# Patient Record
Sex: Male | Born: 1985 | Race: Black or African American | Hispanic: No
Health system: Midwestern US, Community
[De-identification: ages and names within clinical notes are randomized; demographics above are authoritative.]

## PROBLEM LIST (undated history)

## (undated) DIAGNOSIS — L309 Dermatitis, unspecified: Secondary | ICD-10-CM

## (undated) DIAGNOSIS — J45909 Unspecified asthma, uncomplicated: Secondary | ICD-10-CM

## (undated) HISTORY — PX: NASAL SEPTUM SURGERY: SHX37

---

## 2015-07-09 ENCOUNTER — Emergency Department: Admit: 2015-07-10 | Payer: Self-pay

## 2015-07-09 DIAGNOSIS — J45901 Unspecified asthma with (acute) exacerbation: Secondary | ICD-10-CM

## 2015-07-09 NOTE — ED Notes (Signed)
I have reviewed discharge instructions with the patient.  The patient verbalized understanding.

## 2015-07-09 NOTE — ED Provider Notes (Signed)
HPI Comments: Patient presents with recurrent and respiratory distress yesterday had increasing difficulty breathing this evening he has a history of asthma but denies any history of intubation or recent hospitalizations. He denies fever, chills, or cough.     Patient is a 29 y.o. male presenting with shortness of breath. The history is provided by the patient.   Shortness of Breath   This is a new problem. The problem occurs intermittently.The current episode started 12 to 24 hours ago. The problem has been gradually worsening. Associated symptoms include wheezing. Pertinent negatives include no fever, no headaches, no coryza, no rhinorrhea, no sore throat, no swollen glands, no ear pain, no neck pain, no cough, no sputum production, no hemoptysis, no PND, no orthopnea, no chest pain, no syncope, no vomiting, no abdominal pain, no rash, no leg pain, no leg swelling and no claudication. Precipitated by: pt continues to smoke. He has tried nothing for the symptoms. The treatment provided no relief. He has had prior hospitalizations. He has had prior ED visits. He has had no prior ICU admissions. Associated medical issues include asthma.        Past Medical History:   Diagnosis Date   ??? Asthma    ??? Psychiatric disorder        History reviewed. No pertinent past surgical history.      History reviewed. No pertinent family history.    Social History     Social History   ??? Marital status: UNKNOWN     Spouse name: N/A   ??? Number of children: N/A   ??? Years of education: N/A     Occupational History   ??? Not on file.     Social History Main Topics   ??? Smoking status: Current Every Day Smoker   ??? Smokeless tobacco: Not on file   ??? Alcohol use Yes   ??? Drug use: Not on file   ??? Sexual activity: Not on file     Other Topics Concern   ??? Not on file     Social History Narrative   ??? No narrative on file         ALLERGIES: Haldol [haloperidol lactate]    Review of Systems   Constitutional: Negative for fever.    HENT: Negative for ear pain, rhinorrhea and sore throat.    Respiratory: Positive for shortness of breath and wheezing. Negative for cough, hemoptysis and sputum production.    Cardiovascular: Negative for chest pain, orthopnea, claudication, leg swelling, syncope and PND.   Gastrointestinal: Negative for abdominal pain and vomiting.   Musculoskeletal: Negative for neck pain.   Skin: Negative for rash.   Neurological: Negative for headaches.   All other systems reviewed and are negative.      Vitals:    07/09/15 2221 07/09/15 2223 07/09/15 2225   BP:  131/61    Pulse: 84 84    Resp: (!) 52     Temp: 98.1 ??F (36.7 ??C)     SpO2:   98%   Weight:   97.5 kg (215 lb)   Height:    (1.676 m)            Physical Exam   Constitutional: He is oriented to person, place, and time. He appears well-developed and well-nourished. No distress.   HENT:   Head: Normocephalic and atraumatic.   Eyes: Conjunctivae and EOM are normal. Pupils are equal, round, and reactive to light.   Neck: Normal range of motion. Neck supple.  Cardiovascular: Normal rate, regular rhythm and normal heart sounds.    Pulmonary/Chest: He is in respiratory distress. He has wheezes.   Abdominal: Soft. Bowel sounds are normal.   Musculoskeletal: Normal range of motion.   Neurological: He is alert and oriented to person, place, and time.   Skin: Skin is warm and dry.   Psychiatric: He has a normal mood and affect. His behavior is normal.   Nursing note and vitals reviewed.       MDM  Number of Diagnoses or Management Options  Diagnosis management comments: Asthma exacerbation       Amount and/or Complexity of Data Reviewed  Clinical lab tests: ordered and reviewed  Tests in the radiology section of CPT??: ordered and reviewed  Review and summarize past medical records: yes  Independent visualization of images, tracings, or specimens: yes    Risk of Complications, Morbidity, and/or Mortality  Presenting problems: high  Diagnostic procedures: high   Management options: moderate    Patient Progress  Patient progress: stable    ED Course       Procedures

## 2015-07-09 NOTE — ED Triage Notes (Signed)
Pt. Presents to er from the lobby hyperverntilating, states he smokes and has asthma. Refusing i.v at first. Not wanting to sit in the bed

## 2015-07-10 ENCOUNTER — Inpatient Hospital Stay
Admit: 2015-07-10 | Discharge: 2015-07-10 | Disposition: A | Discharge status: Home / Self Care | Payer: Self-pay | Attending: Emergency Medicine

## 2015-07-10 LAB — METABOLIC PANEL, BASIC
Anion gap: 8 mmol/L (ref 7–16)
BUN: 8 MG/DL (ref 6–23)
CO2: 27 mmol/L (ref 21–32)
Calcium: 9.1 MG/DL (ref 8.3–10.4)
Chloride: 104 mmol/L (ref 98–107)
Creatinine: 1.1 MG/DL (ref 0.8–1.5)
GFR est AA: >60 mL/min/{1.73_m2} (ref >60)
GFR est non-AA: >60 mL/min/{1.73_m2} (ref >60)
Glucose: 87 mg/dL (ref 65–100)
Potassium: 3.8 mmol/L (ref 3.5–5.1)
Sodium: 139 mmol/L (ref 136–145)

## 2015-07-10 LAB — BLOOD GAS, ARTERIAL
ALLENS TEST: POSITIVE
BASE EXCESS: 1.7 mmol/L (ref 0–3)
BICARBONATE: 26 mmol/L (ref 22–26)
FIO2: 40 %
PCO2: 39 mmHg (ref 35–45)
PO2: 147 mmHg — ABNORMAL HIGH (ref 75.0–100.0)
pH: 7.44 (ref 7.35–7.45)

## 2015-07-10 LAB — MAGNESIUM: Magnesium: 1.8 mg/dL (ref 1.8–2.4)

## 2015-07-10 LAB — CBC WITH AUTOMATED DIFF
ABS. BASOPHILS: 0 10*3/uL (ref 0.0–0.2)
ABS. EOSINOPHILS: 3.7 10*3/uL — ABNORMAL HIGH (ref 0.0–0.8)
ABS. IMM. GRANS.: 0 10*3/uL (ref 0.0–0.5)
ABS. LYMPHOCYTES: 2 10*3/uL (ref 0.5–4.6)
ABS. MONOCYTES: 0.4 10*3/uL (ref 0.1–1.3)
ABS. NEUTROPHILS: 3.5 10*3/uL (ref 1.7–8.2)
BASOPHILS: 0 % (ref 0.0–2.0)
EOSINOPHILS: 38 % — ABNORMAL HIGH (ref 0.5–7.8)
HCT: 41.6 % (ref 41.1–50.3)
HGB: 13.8 g/dL (ref 13.6–17.2)
IMMATURE GRANULOCYTES: 0.2 % (ref 0.0–5.0)
LYMPHOCYTES: 21 % (ref 13–44)
MCH: 29.4 PG (ref 26.1–32.9)
MCHC: 33.2 g/dL (ref 31.4–35.0)
MCV: 88.5 FL (ref 79.6–97.8)
MONOCYTES: 4 % (ref 4.0–12.0)
MPV: 10.5 FL — ABNORMAL LOW (ref 10.8–14.1)
NEUTROPHILS: 37 % — ABNORMAL LOW (ref 43–78)
PLATELET: 368 10*3/uL (ref 150–450)
RBC: 4.7 M/uL (ref 4.23–5.67)
RDW: 13.5 % (ref 11.9–14.6)
WBC: 9.7 10*3/uL (ref 4.3–11.1)

## 2015-07-10 LAB — BNP: BNP: 4 pg/mL

## 2015-07-10 MED ORDER — ALBUTEROL SULFATE 2.5 MG/0.5 ML NEB SOLUTION
2.5 mg/0.5 mL | RESPIRATORY_TRACT | Status: AC
Start: 2015-07-10 — End: 2015-07-09
  Administered 2015-07-10: 03:00:00 via RESPIRATORY_TRACT

## 2015-07-10 MED ORDER — METHYLPREDNISOLONE (PF) 125 MG/2 ML IJ SOLR
125 mg/2 mL | INTRAMUSCULAR | Status: AC
Start: 2015-07-10 — End: 2015-07-09
  Administered 2015-07-10: 04:00:00 via INTRAVENOUS

## 2015-07-10 MED ORDER — PREDNISONE 20 MG TAB
20 mg | ORAL_TABLET | Freq: Every day | ORAL | 0 refills | Status: AC
Start: 2015-07-10 — End: 2015-07-16

## 2015-07-10 MED ORDER — ALBUTEROL SULFATE 90 MCG/ACTUATION BREATH ACTIVATED POWDER INHALER
90 mcg/actuation | RESPIRATORY_TRACT | 0 refills | Status: AC | PRN
Start: 2015-07-10 — End: ?

## 2015-07-10 MED ORDER — ALBUTEROL SULFATE 0.083 % (0.83 MG/ML) SOLN FOR INHALATION
2.5 mg /3 mL (0.083 %) | RESPIRATORY_TRACT | Status: DC
Start: 2015-07-10 — End: 2015-07-09

## 2015-07-10 MED ORDER — ALBUTEROL SULFATE HFA 90 MCG/ACTUATION AEROSOL INHALER
90 mcg/actuation | RESPIRATORY_TRACT | 2 refills | Status: AC | PRN
Start: 2015-07-10 — End: ?

## 2015-07-10 MED FILL — SOLU-MEDROL (PF) 125 MG/2 ML SOLUTION FOR INJECTION: 125 mg/2 mL | INTRAMUSCULAR | Qty: 2

## 2015-07-10 MED FILL — ALBUTEROL SULFATE 0.083 % (0.83 MG/ML) SOLN FOR INHALATION: 2.5 mg /3 mL (0.083 %) | RESPIRATORY_TRACT | Qty: 4

## 2018-04-27 ENCOUNTER — Encounter (HOSPITAL_COMMUNITY): Payer: Self-pay | Admitting: Nurse Practitioner

## 2018-04-27 ENCOUNTER — Emergency Department (HOSPITAL_COMMUNITY): Payer: Self-pay

## 2018-04-27 ENCOUNTER — Emergency Department (HOSPITAL_COMMUNITY)
Admission: EM | Admit: 2018-04-27 | Discharge: 2018-04-27 | Disposition: A | Discharge status: Home / Self Care | Payer: Self-pay | Attending: Emergency Medicine | Admitting: Emergency Medicine

## 2018-04-27 DIAGNOSIS — J45901 Unspecified asthma with (acute) exacerbation: Secondary | ICD-10-CM | POA: Insufficient documentation

## 2018-04-27 HISTORY — DX: Dermatitis, unspecified: L30.9

## 2018-04-27 HISTORY — DX: Unspecified asthma, uncomplicated: J45.909

## 2018-04-27 LAB — CBC WITH DIFFERENTIAL/PLATELET
BASOS ABS: 0 10*3/uL (ref 0.0–0.1)
BASOS PCT: 0 %
EOS ABS: 2.2 10*3/uL — AB (ref 0.0–0.7)
EOS PCT: 24 %
HEMATOCRIT: 45.3 % (ref 39.0–52.0)
Hemoglobin: 15 g/dL (ref 13.0–17.0)
Lymphocytes Relative: 19 %
Lymphs Abs: 1.7 10*3/uL (ref 0.7–4.0)
MCH: 30.4 pg (ref 26.0–34.0)
MCHC: 33.1 g/dL (ref 30.0–36.0)
MCV: 91.7 fL (ref 78.0–100.0)
MONO ABS: 0.4 10*3/uL (ref 0.1–1.0)
Monocytes Relative: 4 %
Neutro Abs: 5 10*3/uL (ref 1.7–7.7)
Neutrophils Relative %: 53 %
PLATELETS: 266 10*3/uL (ref 150–400)
RBC: 4.94 MIL/uL (ref 4.22–5.81)
RDW: 13.1 % (ref 11.5–15.5)
WBC: 9.3 10*3/uL (ref 4.0–10.5)

## 2018-04-27 LAB — BASIC METABOLIC PANEL
Anion gap: 6 (ref 5–15)
BUN: 11 mg/dL (ref 6–20)
CO2: 25 mmol/L (ref 22–32)
CREATININE: 0.86 mg/dL (ref 0.61–1.24)
Calcium: 9.1 mg/dL (ref 8.9–10.3)
Chloride: 104 mmol/L (ref 98–111)
GFR calc Af Amer: >60 mL/min (ref >60)
GLUCOSE: 118 mg/dL — AB (ref 70–99)
Potassium: 3.7 mmol/L (ref 3.5–5.1)
Sodium: 135 mmol/L (ref 135–145)

## 2018-04-27 MED ORDER — ALBUTEROL (5 MG/ML) CONTINUOUS INHALATION SOLN
10.0000 mg/h | INHALATION_SOLUTION | Freq: Once | RESPIRATORY_TRACT | Status: AC
  Administered 2018-04-27: 10 mg/h via RESPIRATORY_TRACT
  Filled 2018-04-27: qty 20

## 2018-04-27 MED ORDER — ALBUTEROL SULFATE HFA 108 (90 BASE) MCG/ACT IN AERS: 1.0000 | INHALATION_SPRAY | Freq: Four times a day (QID) | RESPIRATORY_TRACT | 3 refills | Status: DC | PRN

## 2018-04-27 MED ORDER — SODIUM CHLORIDE 0.9 % IV BOLUS: 1000.0000 mL | Freq: Once | INTRAVENOUS | Status: DC

## 2018-04-27 MED ORDER — PREDNISONE 20 MG PO TABS: 40.0000 mg | ORAL_TABLET | Freq: Every day | ORAL | 0 refills | Status: AC

## 2018-04-27 MED ORDER — ALBUTEROL SULFATE HFA 108 (90 BASE) MCG/ACT IN AERS
1.0000 | INHALATION_SPRAY | Freq: Once | RESPIRATORY_TRACT | Status: AC
  Administered 2018-04-27: 1 via RESPIRATORY_TRACT
  Filled 2018-04-27: qty 6.7

## 2018-04-27 NOTE — ED Triage Notes (Signed)
Pt is presented from home with c/o an asthma attack, pt states he is new in the area and excess physical activity may have triggered this attack although he has been struggling with a cough/cold since Wednesday. EMS administered 5mg  Albuterol, 1 mg of Atrovent and 125 mg Solumedrol. NO marked response to all this on arrival.

## 2018-04-27 NOTE — Discharge Instructions (Signed)
Take prednisone as directed.   Use albuterol inhaler as directed.   Follow up with [facility] to establish primary care.   Return to the Emergency Department for any worsening difficulty breathing, fever, chest pain or any other worsening or concerning symptoms.

## 2018-04-27 NOTE — ED Provider Notes (Signed)
[facility]-EMERGENCY DEPT Provider Note   CSN: [id number] Arrival date & time: 04/27/18  1821     History   Chief Complaint Chief Complaint  Patient presents with  . Asthma  . Shortness of Breath    HPI Micheal Kirk is a 32 y.o. male brought in by EMS for evaluation of shortness of breath consistent with asthma exacerbation that is been ongoing since 10 AM this morning.  He states he has had some intermittent difficulty breathing, wheezing over the last 3 days but worsened approximately 10 AM.  Patient reports that he recently moved to the area and had been moving boxes and doing a lot of physical activity over the last 24 hours.  He states that dust can be a trigger of his asthma.  He states that he just moved to the area and he does not have any of his inhalers or medication.  He called EMS today because he felt like he was having difficulty breathing and wheezing.  On EMS arrival, patient was wheezing throughout all lung fields.  He received albuterol neb, DuoNeb x2, Solu-Medrol and route with some improvement but still with significant wheezing.  He states that he has had a cough for last few days.  He reports cough is productive of phlegm.  He recalls report some chest tightness which he states is consistent with his asthma exacerbations.  Chest tightness has improved since being in the ED.  Patient denies any fevers, abdominal pain, nausea/vomiting.  He reports he has never had been intubated for his asthma but does report he has had to have admissions.  His last admission was approximately 2 months ago.  The history is provided by the patient.    Past Medical History:  Diagnosis Date  . Asthma   . Eczema     There are no active problems to display for this patient.   The histories are not reviewed yet. Please review them in the "History" navigator section and refresh this SmartLink.      Home Medications    Prior to Admission medications     Medication Sig Start Date End Date Taking? Authorizing Provider  triamcinolone cream (KENALOG) 0.1 % Apply 1 application topically daily.   Yes [provider]  albuterol (PROVENTIL HFA;VENTOLIN HFA) 108 (90 Base) MCG/ACT inhaler Inhale 1-2 puffs into the lungs every 6 (six) hours as needed for wheezing or shortness of breath. 04/27/18   Maxwell Caul, PA-C  predniSONE (DELTASONE) 20 MG tablet Take 2 tablets (40 mg total) by mouth daily for 4 days. 04/27/18 05/01/18  Maxwell Caul, PA-C    Family History No family history on file.  Social History Social History   Tobacco Use  . Smoking status: Not on file  Substance Use Topics  . Alcohol use: Not on file  . Drug use: Not on file     Allergies   Tylenol [acetaminophen]   Review of Systems Review of Systems  Constitutional: Negative for fever.  HENT: Negative for congestion.   Respiratory: Positive for cough, chest tightness, shortness of breath and wheezing.   Cardiovascular: Negative for chest pain.  Gastrointestinal: Negative for abdominal pain, diarrhea, nausea and vomiting.  Musculoskeletal: Negative for back pain and neck pain.  Skin: Negative for rash.  Neurological: Negative for dizziness, weakness, numbness and headaches.  All other systems reviewed and are negative.    Physical Exam Updated Vital Signs BP (!) 153/80 (BP Location: Right Arm)   Pulse Marland Kitchen)  103   Temp 97.9 F (36.6 C) (Oral)   Resp 14   SpO2 100%   Physical Exam  Constitutional: He is oriented to person, place, and time. He appears well-developed and well-nourished.  HENT:  Head: Normocephalic and atraumatic.  Mouth/Throat: Oropharynx is clear and moist and mucous membranes are normal.  Eyes: Pupils are equal, round, and reactive to light. Conjunctivae, EOM and lids are normal.  Neck: Full passive range of motion without pain.  Cardiovascular: Normal rate, regular rhythm, normal heart sounds and normal pulses. Exam reveals no  gallop and no friction rub.  No murmur heard. Pulmonary/Chest: He has wheezes.  Increased work of breathing noted.  Diffuse wheezing noted throughout all lung fields.  Speaking in short sentences.  Abdominal: Soft. Normal appearance. There is no tenderness. There is no rigidity and no guarding.  Musculoskeletal: Normal range of motion.  Neurological: He is alert and oriented to person, place, and time.  Skin: Skin is warm and dry. Capillary refill takes less than 2 seconds.  Psychiatric: He has a normal mood and affect. His speech is normal.  Nursing note and vitals reviewed.    ED Treatments / Results  Labs (all labs ordered are listed, but only abnormal results are displayed) Labs Reviewed  BASIC METABOLIC PANEL - Abnormal; Notable for the following components:      Result Value   Glucose, Bld 118 (*)    All other components within normal limits  CBC WITH DIFFERENTIAL/PLATELET - Abnormal; Notable for the following components:   Eosinophils Absolute 2.2 (*)    All other components within normal limits    EKG EKG Interpretation  Date/Time:  Sunday April 27 2018 19:07:32 EDT Ventricular Rate:  84 PR Interval:    QRS Duration: 93 QT Interval:  364 QTC Calculation: 431 R Axis:   32 Text Interpretation:  Sinus rhythm Baseline wander in lead(s) V6 No old tracing to compare Confirmed by Wentz, Elliott ([id number]) on 04/27/2018 8:20:49 PM   Radiology Dg Chest 2 View  Result Date: 04/27/2018 CLINICAL DATA:  Patient with cough.  Shortness of breath. EXAM: CHEST - 2 VIEW COMPARISON:  None. FINDINGS: Monitoring leads overlie the patient. Normal cardiac and mediastinal contours. No consolidative pulmonary opacities. No pleural effusion pneumothorax. Regional skeleton is unremarkable. IMPRESSION: No acute cardiopulmonary process. Electronically Signed   By: Drew  Davis M.D.   On: 04/27/2018 19:11    Procedures Procedures (including critical care time)  Medications Ordered in  ED Medications  albuterol (PROVENTIL,VENTOLIN) solution continuous neb (10 mg/hr Nebulization Given 04/27/18 1927)  albuterol (PROVENTIL HFA;VENTOLIN HFA) 108 (90 Base) MCG/ACT inhaler 1 puff (1 puff Inhalation Given 04/27/18 2218)     Initial Impression / Assessment and Plan / ED Course  I have reviewed the triage vital signs and the nursing notes.  Pertinent labs & imaging results that were available during my care of the patient were reviewed by me and considered in my medical decision making (see chart for details).     32  year old male past with history of asthma who presents for evaluation of shortness of breath and wheezing.  Reports he has been having some wheezing over the last 3 days.  Shortness of breath began approximately 10 AM.  Recently moved to [facility] and did not have his inhalers.  Reports he been moving boxes and states that he gets triggered with asthma and physical activity.  Patient received 5 albuterol, DuoNeb x2 and 125 mg of Solu-Medrol in route by EMS.  He reported  some improvement but still wheezing.  Reported some chest tightness initially but states improved after treatments from EMS.  Also reports recent cough.  No fevers. Patient is afebrile.  Vital signs reviewed and stable.  Patient still with significant wheezing throughout all lung fields.  He has already had 3 breathing treatments. Will start him on continue nebs. Plan for CXR, Basic labs. Consider asthma exacerbation. History/physical exam is not concerning for PE.   Chest x-ray negative for any acute infectious etiology.  CBC without any significant leukocytosis.  BMP unremarkable.  Reevaluation.  Patient does have quite done with continuous nebulizer treatment.  He reports some improvement in wheezing.  Reevaluation shows wheezing has improved but still some residual wheezing noted.  Will re-eval.  Reevaluation after continuous nebulizer.  Patient reports improvement in symptoms.  Wheezing has improved  significantly.  Will plan to ambulate with checking pulse ox.  Patient ambulated in the department without any difficulty.  His O2 sats remained 100% on room air and he states he is not having difficulty breathing while walking.  Reevaluation shows wheezing has resolved.  Patient reports feeling better and states that he no longer feels like he is having difficulty breathing, chest tightness.  Vital signs are stable.  He is slightly tachycardic but this is most likely due to albuterol that he is received here in the ED.  We will plan to give him albuterol inhaler to go home with.  Additionally, will plan for refills of inhaler given that he does not have a primary care doctor yet.  We will also send him on a short course of prednisone for asthma exacerbation.  [facility] given [facility] for [facility]. Patient had ample opportunity for questions and discussion. All patient's questions were answered with full understanding. Strict return precautions discussed. Patient expresses understanding and agreement to plan.   Final Clinical Impressions(s) / ED Diagnoses   Final diagnoses:  Exacerbation of asthma, unspecified asthma severity, unspecified whether persistent    ED Discharge Orders         Ordered    albuterol (PROVENTIL HFA;VENTOLIN HFA) 108 (90 Base) MCG/ACT inhaler  Every 6 hours PRN     04/27/18 2218    predniSONE (DELTASONE) 20 MG tablet  Daily     04/27/18 2218           Maxwell Caul, PA-C 04/27/18 2223    Gwyneth Sprout, MD 04/28/18 2327

## 2018-04-27 NOTE — ED Notes (Signed)
Bed: QQ59 Expected date:  Expected time:  Means of arrival:  Comments: 32 yo asthma

## 2018-05-09 ENCOUNTER — Encounter (HOSPITAL_COMMUNITY): Payer: Self-pay

## 2018-05-09 ENCOUNTER — Emergency Department (HOSPITAL_COMMUNITY)
Admission: EM | Admit: 2018-05-09 | Discharge: 2018-05-09 | Disposition: A | Discharge status: Home / Self Care | Payer: Medicaid Other | Attending: Emergency Medicine | Admitting: Emergency Medicine

## 2018-05-09 ENCOUNTER — Other Ambulatory Visit: Payer: Self-pay

## 2018-05-09 DIAGNOSIS — G8929 Other chronic pain: Secondary | ICD-10-CM | POA: Insufficient documentation

## 2018-05-09 DIAGNOSIS — Z760 Encounter for issue of repeat prescription: Secondary | ICD-10-CM | POA: Insufficient documentation

## 2018-05-09 DIAGNOSIS — M25561 Pain in right knee: Secondary | ICD-10-CM

## 2018-05-09 DIAGNOSIS — Z8709 Personal history of other diseases of the respiratory system: Secondary | ICD-10-CM | POA: Insufficient documentation

## 2018-05-09 DIAGNOSIS — F17210 Nicotine dependence, cigarettes, uncomplicated: Secondary | ICD-10-CM | POA: Insufficient documentation

## 2018-05-09 MED ORDER — ALBUTEROL SULFATE HFA 108 (90 BASE) MCG/ACT IN AERS
2.0000 | INHALATION_SPRAY | Freq: Once | RESPIRATORY_TRACT | Status: AC
  Administered 2018-05-09: 2 via RESPIRATORY_TRACT
  Filled 2018-05-09: qty 6.7

## 2018-05-09 MED ORDER — ALBUTEROL SULFATE HFA 108 (90 BASE) MCG/ACT IN AERS: 2.0000 | INHALATION_SPRAY | RESPIRATORY_TRACT | 1 refills | Status: AC | PRN

## 2018-05-09 NOTE — ED Triage Notes (Signed)
Pt presents to ED from home for R knee pain. Pt reports that he has an injury in high school, but was unable to have surgery on it. Pt reports that in his new job he is on his feet for 12 hours at a time, and has been having worsening pain. Pt reports that he needs documentation for work that he can't stand for that long.

## 2018-05-09 NOTE — Discharge Instructions (Signed)
There is definite evidence of abnormality in your knee, causing some instability.  Please wear the knee brace whenever you are ambulating.  Follow-up with orthopedist as soon as possible in this manner.  Follow-up with a primary care provider regarding your asthma management.

## 2018-05-09 NOTE — ED Provider Notes (Signed)
[facility]-EMERGENCY DEPT Provider Note   CSN: [id number] Arrival date & time: 05/09/18  1927     History   Chief Complaint Chief Complaint  Patient presents with  . Knee Pain    R knee    HPI Micheal Kirk is a 32 y.o. male.  HPI   Micheal Kirk is a 32 y.o. male, with a history of asthma, presenting to the ED with right knee pain for several years.  States he originally sustained an ACL tear in high school, but never had surgical repair performed.  He has had pain in this knee since then along with intermittent episodes of laxity. Over the past couple months he has had worsening pain and a couple of episodes of his knee "giving out." Denies numbness, weakness, acute injury.  Patient states he is new to the area and has not yet established himself with a PCP.  He is requesting an albuterol prescription.  Denies current symptoms of exacerbation.    Past Medical History:  Diagnosis Date  . Asthma   . Eczema     There are no active problems to display for this patient.   Past Surgical History:  Procedure Laterality Date  . NASAL SEPTUM SURGERY          Home Medications    Prior to Admission medications   Medication Sig Start Date End Date Taking? Authorizing Provider  albuterol (PROVENTIL HFA;VENTOLIN HFA) 108 (90 Base) MCG/ACT inhaler Inhale 1-2 puffs into the lungs every 6 (six) hours as needed for wheezing or shortness of breath. 04/27/18   Maxwell CaulLayden, Lindsey A, PA-C  albuterol (PROVENTIL HFA;VENTOLIN HFA) 108 (90 Base) MCG/ACT inhaler Inhale 2 puffs into the lungs every 4 (four) hours as needed for wheezing or shortness of breath. 05/09/18   Demone Lyles C, PA-C  triamcinolone cream (KENALOG) 0.1 % Apply 1 application topically daily.    [provider]    Family History History reviewed. No pertinent family history.  Social History Social History   Tobacco Use  . Smoking status: Current Every Day Smoker   Packs/day: 0.50  . Smokeless tobacco: Never Used  Substance Use Topics  . Alcohol use: Not Currently  . Drug use: Not Currently     Allergies   Tylenol [acetaminophen]   Review of Systems Review of Systems  Constitutional: Negative for chills, diaphoresis and fever.  Respiratory: Negative for cough and shortness of breath.   Cardiovascular: Negative for chest pain and leg swelling.  Gastrointestinal: Negative for abdominal pain, nausea and vomiting.  Musculoskeletal: Positive for arthralgias.  Neurological: Negative for weakness and numbness.  All other systems reviewed and are negative.    Physical Exam Updated Vital Signs BP (!) 131/93 (BP Location: Left Arm)   Pulse 90   Temp 98.1 F (36.7 C) (Oral)   Resp 20   Ht 5\' 6"  (1.676 m)   Wt 102.1 kg   SpO2 99%   BMI 36.32 kg/m   Physical Exam  Constitutional: He appears well-developed and well-nourished. No distress.  HENT:  Head: Normocephalic and atraumatic.  Eyes: Conjunctivae are normal.  Neck: Neck supple.  Cardiovascular: Normal rate, regular rhythm, normal heart sounds and intact distal pulses.  Pulmonary/Chest: Effort normal and breath sounds normal. No respiratory distress.  Abdominal: There is no guarding.  Musculoskeletal: He exhibits tenderness. He exhibits no edema.  Patient has tenderness to the right knee, especially along the lateral aspect.  He has some laxity with varus stress along  with significantly increased pain. Range of motion intact, though painful.  Neurological: He is alert.  Sensation to light touch grossly intact in the right lower extremity. Strength 5/5 in the right knee. Patient ambulatory without assistance.  Skin: Skin is warm and dry. He is not diaphoretic.  Psychiatric: He has a normal mood and affect. His behavior is normal.  Nursing note and vitals reviewed.    ED Treatments / Results  Labs (all labs ordered are listed, but only abnormal results are displayed) Labs  Reviewed - No data to display  EKG None  Radiology No results found.  Procedures Procedures (including critical care time)  Medications Ordered in ED Medications  albuterol (PROVENTIL HFA;VENTOLIN HFA) 108 (90 Base) MCG/ACT inhaler 2 puff (2 puffs Inhalation Given 05/09/18 2153)     Initial Impression / Assessment and Plan / ED Course  I have reviewed the triage vital signs and the nursing notes.  Pertinent labs & imaging results that were available during my care of the patient were reviewed by me and considered in my medical decision making (see chart for details).     Patient presents with worsening chronic right knee pain.  Knee immobilizer and orthopedic follow-up.  Patient was given an albuterol inhaler as well as a prescription.  Case management consult was utilized to assist patient with establishing care.  The patient was given instructions for home care as well as return precautions. Patient voices understanding of these instructions, accepts the plan, and is comfortable with discharge.  Final Clinical Impressions(s) / ED Diagnoses   Final diagnoses:  Chronic pain of right knee    ED Discharge Orders         Ordered    albuterol (PROVENTIL HFA;VENTOLIN HFA) 108 (90 Base) MCG/ACT inhaler  Every 4 hours PRN     05/09/18 2132           Anselm Pancoast, PA-C 05/09/18 2218    Melene Plan, DO 05/09/18 2237

## 2018-06-10 ENCOUNTER — Other Ambulatory Visit: Payer: Self-pay

## 2018-06-10 ENCOUNTER — Emergency Department (HOSPITAL_COMMUNITY): Payer: Self-pay

## 2018-06-10 ENCOUNTER — Encounter (HOSPITAL_COMMUNITY): Payer: Self-pay | Admitting: *Deleted

## 2018-06-10 ENCOUNTER — Observation Stay (HOSPITAL_COMMUNITY)
Admission: EM | Admit: 2018-06-10 | Discharge: 2018-06-11 | Disposition: A | Discharge status: Home / Self Care | Payer: Self-pay | Attending: Internal Medicine | Admitting: Internal Medicine

## 2018-06-10 DIAGNOSIS — J45901 Unspecified asthma with (acute) exacerbation: Secondary | ICD-10-CM | POA: Insufficient documentation

## 2018-06-10 DIAGNOSIS — F17210 Nicotine dependence, cigarettes, uncomplicated: Secondary | ICD-10-CM | POA: Insufficient documentation

## 2018-06-10 DIAGNOSIS — L309 Dermatitis, unspecified: Secondary | ICD-10-CM | POA: Diagnosis present

## 2018-06-10 DIAGNOSIS — Z79899 Other long term (current) drug therapy: Secondary | ICD-10-CM | POA: Insufficient documentation

## 2018-06-10 DIAGNOSIS — Z886 Allergy status to analgesic agent status: Secondary | ICD-10-CM | POA: Insufficient documentation

## 2018-06-10 DIAGNOSIS — J45902 Unspecified asthma with status asthmaticus: Principal | ICD-10-CM | POA: Insufficient documentation

## 2018-06-10 LAB — BLOOD GAS, VENOUS
Acid-Base Excess: 2.9 mmol/L — ABNORMAL HIGH (ref 0.0–2.0)
Bicarbonate: 26.4 mmol/L (ref 20.0–28.0)
O2 Content: 8 L/min
O2 SAT: 78.1 %
PATIENT TEMPERATURE: 98.6
PCO2 VEN: 38.7 mmHg — AB (ref 44.0–60.0)
pH, Ven: 7.449 — ABNORMAL HIGH (ref 7.250–7.430)
pO2, Ven: 42.2 mmHg (ref 32.0–45.0)

## 2018-06-10 LAB — BASIC METABOLIC PANEL
ANION GAP: 9 (ref 5–15)
BUN: 13 mg/dL (ref 6–20)
CALCIUM: 9.3 mg/dL (ref 8.9–10.3)
CO2: 25 mmol/L (ref 22–32)
Chloride: 107 mmol/L (ref 98–111)
Creatinine, Ser: 0.87 mg/dL (ref 0.61–1.24)
GFR calc Af Amer: >60 mL/min (ref >60)
GFR calc non Af Amer: >60 mL/min (ref >60)
GLUCOSE: 77 mg/dL (ref 70–99)
Potassium: 3.8 mmol/L (ref 3.5–5.1)
Sodium: 141 mmol/L (ref 135–145)

## 2018-06-10 LAB — CBC
HCT: 45.5 % (ref 39.0–52.0)
Hemoglobin: 14.9 g/dL (ref 13.0–17.0)
MCH: 29.2 pg (ref 26.0–34.0)
MCHC: 32.7 g/dL (ref 30.0–36.0)
MCV: 89 fL (ref 80.0–100.0)
NRBC: 0 % (ref 0.0–0.2)
PLATELETS: 321 10*3/uL (ref 150–400)
RBC: 5.11 MIL/uL (ref 4.22–5.81)
RDW: 12.8 % (ref 11.5–15.5)
WBC: 9.8 10*3/uL (ref 4.0–10.5)

## 2018-06-10 LAB — I-STAT TROPONIN, ED: Troponin i, poc: 0 ng/mL (ref 0.00–0.08)

## 2018-06-10 LAB — BRAIN NATRIURETIC PEPTIDE: B NATRIURETIC PEPTIDE 5: 22.6 pg/mL (ref 0.0–100.0)

## 2018-06-10 MED ORDER — IPRATROPIUM BROMIDE 0.02 % IN SOLN
RESPIRATORY_TRACT | Status: AC
  Filled 2018-06-10: qty 2.5

## 2018-06-10 MED ORDER — IPRATROPIUM-ALBUTEROL 0.5-2.5 (3) MG/3ML IN SOLN: 3.0000 mL | Freq: Four times a day (QID) | RESPIRATORY_TRACT | Status: DC

## 2018-06-10 MED ORDER — ALBUTEROL (5 MG/ML) CONTINUOUS INHALATION SOLN
10.0000 mg/h | INHALATION_SOLUTION | RESPIRATORY_TRACT | Status: DC
  Administered 2018-06-10: 10 mg/h via RESPIRATORY_TRACT
  Filled 2018-06-10: qty 20

## 2018-06-10 MED ORDER — ALBUTEROL (5 MG/ML) CONTINUOUS INHALATION SOLN
INHALATION_SOLUTION | RESPIRATORY_TRACT | Status: AC
  Administered 2018-06-10: 19:00:00
  Filled 2018-06-10: qty 20

## 2018-06-10 MED ORDER — ALBUTEROL (5 MG/ML) CONTINUOUS INHALATION SOLN
10.0000 mg/h | INHALATION_SOLUTION | RESPIRATORY_TRACT | Status: DC
  Filled 2018-06-10: qty 20

## 2018-06-10 MED ORDER — ALBUTEROL SULFATE (2.5 MG/3ML) 0.083% IN NEBU: 5.0000 mg | INHALATION_SOLUTION | Freq: Once | RESPIRATORY_TRACT | Status: DC

## 2018-06-10 MED ORDER — METHYLPREDNISOLONE SODIUM SUCC 125 MG IJ SOLR
125.0000 mg | Freq: Once | INTRAMUSCULAR | Status: AC
  Administered 2018-06-10: 125 mg via INTRAVENOUS
  Filled 2018-06-10: qty 2

## 2018-06-10 MED ORDER — MAGNESIUM SULFATE 2 GM/50ML IV SOLN
2.0000 g | Freq: Once | INTRAVENOUS | Status: AC
  Administered 2018-06-10: 2 g via INTRAVENOUS
  Filled 2018-06-10: qty 50

## 2018-06-10 MED ORDER — IPRATROPIUM BROMIDE 0.02 % IN SOLN
0.5000 mg | Freq: Once | RESPIRATORY_TRACT | Status: AC
  Administered 2018-06-10: 0.5 mg via RESPIRATORY_TRACT

## 2018-06-10 NOTE — ED Triage Notes (Signed)
Pt was dropped off by his friend.  He reports diff breathing since early this morning.  Pt has hx of asthma, states he ran out of his nebulizer.  He is A&Ox4.  Pt is hyperventilating upon arrival to the ED.  He is able to provide information and complete a sentence without pausing.  He also endorses cp.

## 2018-06-10 NOTE — ED Provider Notes (Signed)
[facility]-EMERGENCY DEPT Provider Note   CSN: [id number] Arrival date & time: 06/10/18  1833     History   Chief Complaint Chief Complaint  Patient presents with  . Shortness of Breath    HPI Micheal Kirk is a 32 y.o. male with a pmh of Asthma who presents in Respiratory distress. Patient was found in the [place].  Patient states that he has had worsening wheezing and shortness of breath all day long.  He has a history of previous asthma exacerbations including need for hospitalization and intubation.  He denies any recent URIs.  He denies daily smoking.  He denies a history of CHF exacerbation or pulmonary emboli.  HPI  Past Medical History:  Diagnosis Date  . Asthma   . Eczema     There are no active problems to display for this patient.   Past Surgical History:  Procedure Laterality Date  . NASAL SEPTUM SURGERY          Home Medications    Prior to Admission medications   Medication Sig Start Date End Date Taking? Authorizing Provider  albuterol (PROVENTIL HFA;VENTOLIN HFA) 108 (90 Base) MCG/ACT inhaler Inhale 1-2 puffs into the lungs every 6 (six) hours as needed for wheezing or shortness of breath. 04/27/18   Maxwell Caul, PA-C  albuterol (PROVENTIL HFA;VENTOLIN HFA) 108 (90 Base) MCG/ACT inhaler Inhale 2 puffs into the lungs every 4 (four) hours as needed for wheezing or shortness of breath. 05/09/18   Joy, Shawn C, PA-C  triamcinolone cream (KENALOG) 0.1 % Apply 1 application topically daily.    [provider]    Family History No family history on file.  Social History Social History   Tobacco Use  . Smoking status: Current Every Day Smoker    Packs/day: 0.50  . Smokeless tobacco: Never Used  Substance Use Topics  . Alcohol use: Not Currently  . Drug use: Not Currently     Allergies   Tylenol [acetaminophen]   Review of Systems Review of Systems Ten systems reviewed and are negative for acute change,  except as noted in the HPI.    Physical Exam Updated Vital Signs Ht 5\' 6"  (1.676 m)   Wt 104.3 kg   SpO2 99%   BMI 37.12 kg/m   Physical Exam  Constitutional: He is oriented to person, place, and time. He appears well-developed and well-nourished.  HENT:  Head: Normocephalic and atraumatic.  Mouth/Throat: Oropharynx is clear and moist.  Eyes: Pupils are equal, round, and reactive to light. Conjunctivae and EOM are normal. No scleral icterus.  Neck: Normal range of motion. Neck supple.  Cardiovascular: Normal rate, regular rhythm and normal heart sounds.  Pulmonary/Chest: Tachypnea noted. He has wheezes in the right upper field, the right middle field, the right lower field, the left upper field, the left middle field and the left lower field.  Patient's breathing is markedly labored with accessory muscle use, he is speaking in 2-3 word sentences.  Diffuse wheezing inspiratory and expiratory is quiet secondary to poor air movement  Abdominal: Soft. There is no tenderness.  Musculoskeletal: He exhibits no edema.  Neurological: He is alert and oriented to person, place, and time.  Skin: Skin is warm and dry. He is not diaphoretic.  Generalized atopic dermatitis  Psychiatric: His behavior is normal.  Nursing note and vitals reviewed.    ED Treatments / Results  Labs (all labs ordered are listed, but only abnormal results are displayed) Labs Reviewed -  No data to display  EKG None  Radiology No results found.  Procedures .Critical Care Performed by: Arthor Captain, PA-C Authorized by: Arthor Captain, PA-C   Critical care provider statement:    Critical care time (minutes):  60   Critical care was necessary to treat or prevent imminent or life-threatening deterioration of the following conditions:  Respiratory failure   Critical care was time spent personally by me on the following activities:  Discussions with consultants, evaluation of patient's response to treatment,  examination of patient, ordering and performing treatments and interventions, ordering and review of laboratory studies, ordering and review of radiographic studies, pulse oximetry, re-evaluation of patient's condition, obtaining history from patient or surrogate and review of old charts   (including critical care time)  Medications Ordered in ED Medications  albuterol (PROVENTIL) (2.5 MG/3ML) 0.083% nebulizer solution 5 mg (has no administration in time range)  albuterol (PROVENTIL,VENTOLIN) solution continuous neb (has no administration in time range)  ipratropium-albuterol (DUONEB) 0.5-2.5 (3) MG/3ML nebulizer solution 3 mL (has no administration in time range)  ipratropium (ATROVENT) nebulizer solution 0.5 mg (has no administration in time range)  albuterol (PROVENTIL, VENTOLIN) (5 MG/ML) 0.5% continuous inhalation solution (  Given 06/10/18 1842)     Initial Impression / Assessment and Plan / ED Course  I have reviewed the triage vital signs and the nursing notes.  Pertinent labs & imaging results that were available during my care of the patient were reviewed by me and considered in my medical decision making (see chart for details).     Patient arrived with respiratory distress secondary to shortness of breath and wheezing. The emergent differential diagnosis for shortness of breath includes, but is not limited to, Pulmonary edema, bronchoconstriction, Pneumonia, Pulmonary embolism, Pneumotherax/ Hemothorax, Dysrythmia, ACS.  Given the patient's progressive wheezing and shortness of breath and history of asthma secondary to asthma exacerbation.  Patient was seen immediately in the [place] upon arrival by myself and respiratory therapist.  He started on hour-long neb treatment, I ordered Solu-Medrol and magnesium.   Patient's labs have returned.  He has no significant abnormalities.  Chest x-ray shows some mild interstitial abnormalities which is likely reactive airway disease.  I  doubt infectious processes the patient is afebrile.     Final Clinical Impressions(s) / ED Diagnoses   Final diagnoses:  Persistent asthma with status asthmaticus, unspecified asthma severity    ED Discharge Orders    None       Arthor Captain, PA-C 06/11/18 1508    Gwyneth Sprout, MD 06/11/18 [phone]

## 2018-06-11 ENCOUNTER — Encounter (HOSPITAL_COMMUNITY): Payer: Self-pay

## 2018-06-11 ENCOUNTER — Other Ambulatory Visit: Payer: Self-pay

## 2018-06-11 DIAGNOSIS — L309 Dermatitis, unspecified: Secondary | ICD-10-CM

## 2018-06-11 DIAGNOSIS — J45901 Unspecified asthma with (acute) exacerbation: Secondary | ICD-10-CM | POA: Diagnosis present

## 2018-06-11 DIAGNOSIS — J45902 Unspecified asthma with status asthmaticus: Secondary | ICD-10-CM | POA: Diagnosis present

## 2018-06-11 LAB — HIV ANTIBODY (ROUTINE TESTING W REFLEX): HIV SCREEN 4TH GENERATION: NONREACTIVE

## 2018-06-11 MED ORDER — ENOXAPARIN SODIUM 40 MG/0.4ML ~~LOC~~ SOLN: 40.0000 mg | Freq: Every day | SUBCUTANEOUS | Status: DC

## 2018-06-11 MED ORDER — PREDNISONE 20 MG PO TABS
40.0000 mg | ORAL_TABLET | Freq: Every day | ORAL | Status: DC
  Administered 2018-06-11: 40 mg via ORAL
  Filled 2018-06-11: qty 2

## 2018-06-11 MED ORDER — IPRATROPIUM BROMIDE 0.02 % IN SOLN: 0.5000 mg | Freq: Four times a day (QID) | RESPIRATORY_TRACT | Status: DC

## 2018-06-11 MED ORDER — IPRATROPIUM-ALBUTEROL 0.5-2.5 (3) MG/3ML IN SOLN: 3.0000 mL | Freq: Four times a day (QID) | RESPIRATORY_TRACT | Status: DC

## 2018-06-11 MED ORDER — PREDNISONE 20 MG PO TABS: 40.0000 mg | ORAL_TABLET | Freq: Every day | ORAL | 0 refills | Status: AC

## 2018-06-11 MED ORDER — MOMETASONE FURO-FORMOTEROL FUM 200-5 MCG/ACT IN AERO
1.0000 | INHALATION_SPRAY | Freq: Two times a day (BID) | RESPIRATORY_TRACT | Status: DC
  Administered 2018-06-11: 1 via RESPIRATORY_TRACT
  Filled 2018-06-11: qty 8.8

## 2018-06-11 MED ORDER — ALBUTEROL SULFATE (2.5 MG/3ML) 0.083% IN NEBU: 2.5000 mg | INHALATION_SOLUTION | RESPIRATORY_TRACT | Status: DC | PRN

## 2018-06-11 MED ORDER — IPRATROPIUM-ALBUTEROL 0.5-2.5 (3) MG/3ML IN SOLN
3.0000 mL | Freq: Four times a day (QID) | RESPIRATORY_TRACT | Status: DC
  Administered 2018-06-11: 3 mL via RESPIRATORY_TRACT
  Filled 2018-06-11: qty 3

## 2018-06-11 NOTE — Progress Notes (Signed)
Pt was discharged home today. Instructions were reviewed with patient, and questions were answered. Pt was taken to main entrance via wheelchair by NT.  

## 2018-06-11 NOTE — H&P (Signed)
History and Physical    Graysen Woodyard EAV:[id number] DOB: 09-06-1985 DOA: 06/10/2018  PCP: Patient, No Pcp Per  Patient coming from: Home  I have personally briefly reviewed patient's old medical records in [facility]  Chief Complaint: Asthma attack  HPI: Jag Lenz is a 32 y.o. male with medical history significant of asthma including needing hospitalization and intubation for asthma in past.  Patient presents to ED with c/o asthma attack, onset early this AM, persistent and worsening since that time.  Ran out of home nebulizer.  No h/o CHF, PE.   ED Course: Patient improved somewhat after multiple inhalers, solumedrol, mag sulfate, but still having some symptoms.   Review of Systems: As per HPI otherwise 10 point review of systems negative.   Past Medical History:  Diagnosis Date  . Asthma   . Eczema     Past Surgical History:  Procedure Laterality Date  . NASAL SEPTUM SURGERY       reports that he has been smoking. He has been smoking about 0.50 packs per day. He has never used smokeless tobacco. He reports that he drank alcohol. He reports that he has current or past drug history.  Allergies  Allergen Reactions  . Tylenol [Acetaminophen] Hives and Rash    No family history on file.   Prior to Admission medications   Medication Sig Start Date End Date Taking? Authorizing Provider  albuterol (PROVENTIL HFA;VENTOLIN HFA) 108 (90 Base) MCG/ACT inhaler Inhale 1-2 puffs into the lungs every 6 (six) hours as needed for wheezing or shortness of breath. 04/27/18  Yes Graciella Freer A, PA-C  albuterol (PROVENTIL HFA;VENTOLIN HFA) 108 (90 Base) MCG/ACT inhaler Inhale 2 puffs into the lungs every 4 (four) hours as needed for wheezing or shortness of breath. Patient not taking: Reported on 06/10/2018 05/09/18   Anselm Pancoast, PA-C    Physical Exam: Vitals:   06/10/18 1836 06/10/18 1843 06/10/18 2209  BP:   (!) 146/69  Pulse:   96  Resp:   (!) 22    SpO2:  99% 100%  Weight: 104.3 kg    Height: 5\' 6"  (1.676 m)      Constitutional: NAD, calm, comfortable Eyes: PERRL, lids and conjunctivae normal ENMT: Mucous membranes are moist. Posterior pharynx clear of any exudate or lesions.Normal dentition.  Neck: normal, supple, no masses, no thyromegaly Respiratory: B wheezing Cardiovascular: Regular rate and rhythm, no murmurs / rubs / gallops. No extremity edema. 2+ pedal pulses. No carotid bruits.  Abdomen: no tenderness, no masses palpated. No hepatosplenomegaly. Bowel sounds positive.  Musculoskeletal: no clubbing / cyanosis. No joint deformity upper and lower extremities. Good ROM, no contractures. Normal muscle tone.  Skin: no rashes, lesions, ulcers. No induration Neurologic: CN 2-12 grossly intact. Sensation intact, DTR normal. Strength 5/5 in all 4.  Psychiatric: Normal judgment and insight. Alert and oriented x 3. Normal mood.    Labs on Admission: I have personally reviewed following labs and imaging studies  CBC: Recent Labs  Lab 06/10/18 1848  WBC 9.8  HGB 14.9  HCT 45.5  MCV 89.0  PLT 321   Basic Metabolic Panel: Recent Labs  Lab 06/10/18 1848  NA 141  K 3.8  CL 107  CO2 25  GLUCOSE 77  BUN 13  CREATININE 0.87  CALCIUM 9.3   GFR: Estimated Creatinine Clearance: 136.7 mL/min (by C-G formula based on SCr of 0.87 mg/dL). Liver Function Tests: No results for input(s): AST, ALT, ALKPHOS, BILITOT, PROT, ALBUMIN in  the last 168 hours. No results for input(s): LIPASE, AMYLASE in the last 168 hours. No results for input(s): AMMONIA in the last 168 hours. Coagulation Profile: No results for input(s): INR, PROTIME in the last 168 hours. Cardiac Enzymes: No results for input(s): CKTOTAL, CKMB, CKMBINDEX, TROPONINI in the last 168 hours. BNP (last 3 results) No results for input(s): PROBNP in the last 8760 hours. HbA1C: No results for input(s): HGBA1C in the last 72 hours. CBG: No results for input(s): GLUCAP in  the last 168 hours. Lipid Profile: No results for input(s): CHOL, HDL, LDLCALC, TRIG, CHOLHDL, LDLDIRECT in the last 72 hours. Thyroid Function Tests: No results for input(s): TSH, T4TOTAL, FREET4, T3FREE, THYROIDAB in the last 72 hours. Anemia Panel: No results for input(s): VITAMINB12, FOLATE, FERRITIN, TIBC, IRON, RETICCTPCT in the last 72 hours. Urine analysis: No results found for: COLORURINE, APPEARANCEUR, LABSPEC, PHURINE, GLUCOSEU, HGBUR, BILIRUBINUR, KETONESUR, PROTEINUR, UROBILINOGEN, NITRITE, LEUKOCYTESUR  Radiological Exams on Admission: Dg Chest Port 1 View  Result Date: 06/10/2018 CLINICAL DATA:  Respiratory distress EXAM: PORTABLE CHEST 1 VIEW COMPARISON:  04/27/2018 FINDINGS: Mild diffuse interstitial opacity. No pleural effusion. Normal heart size. No pneumothorax. IMPRESSION: Mild diffuse interstitial opacity, could be secondary to reactive airways or interstitial inflammatory process. No focal airspace disease. Electronically Signed   By: Jasmine Pang M.D.   On: 06/10/2018 19:10    EKG: Independently reviewed.  Assessment/Plan Active Problems:   Status asthmaticus    1. Status asthmaticus - 1. COPD pathway 2. Prednisone PO 3. Scheduled and PRN nebs  DVT prophylaxis: Lovenox Code Status: Full Family Communication: No family in room Disposition Plan: Home when improved, possibly later this morning Consults called: None Admission status: Place in obs   GARDNER, JARED M. DO [facility] Pager [phone] Only works nights!  If 7AM-7PM, please contact the primary day team physician taking care of patient  [email or web address] Password [email or web address]  06/11/2018, 12:09 AM

## 2018-06-11 NOTE — Discharge Summary (Addendum)
Discharge Summary  Micheal Kirk ZOX:[id number] DOB: July 04, 1986  PCP: Patient, No Pcp Per  Admit date: 06/10/2018 Discharge date: 06/11/2018   Time spent: < 25 minutes  Admitted From: home Disposition:  home  Recommendations for Outpatient Follow-up:  1. Follow up with PCP in 1 week 2. Albuterol PRN (new script provided), prednisone 40 mg x 4 days     Discharge Diagnoses:  Active Hospital Problems   Diagnosis Date Noted  . Status asthmaticus 06/11/2018    Resolved Hospital Problems  No resolved problems to display.    Discharge Condition: Stable   CODE STATUS:FULL COde   History of present illness:  Micheal Kirk is a 32 y.o. year old male with medical history significant for asthma and eczema who presented on 06/10/2018 with several days of worsening dyspnea and was found to have acute asthma exacerbation. States he noticed worsening shortness of breath while standing at work. Initially responded to albuterol inhaler but then he used it so much he ran out of it. He felt some chest tightness as well and difficulty catching his breath even in his bed. Denied lower leg swelling, fevers, chills, recent sick contacts. Does report he has worked in [employer] sites and has been in fairly dusty attics recently.  Remaining hospital course addressed in problem based format below:   Hospital Course:   Acute Asthma Exacerbation.  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  Previously asthma controlled with as needed use of albuterol.  Chest x-ray showed no focal airspace disease but did show diffuse interstitial opacities likely secondary to reactive airways, had no oxygen requirements, no fevers or chills, no leukocytosis.  Within 24 hours resolution of wheezing with intermittent rhonchi only.  Other contributing factor for financial resources.  Provided new prescription for  al[facility]erol to [facility] as well as PCP follow-up.  Patient responded well to schedule albuterol nebs during hospital stay initiation of prednisone here.  We will continue prednisone for additional 4 days on discharge close PCP follow-up as mentioned above.  Severe Exzema.  Continue PRN triamcinolone cream.  Expect prednisone therapy will be helpful as well.    Consultations:  none  Procedures/Studies: none  Discharge Exam: BP (!) 165/84 (BP Location: Left Arm)   Pulse (!) 116   Temp 97.8 F (36.6 C) (Oral)   Resp 18   Ht 5\' 6"  (1.676 m)   Wt 104.3 kg   SpO2 96%   BMI 37.12 kg/m   General: Lying in bed, no apparent distress Eyes: EOMI, anicteric ENT: Oral Mucosa clear and moist Cardiovascular: regular rate and rhythm, no murmurs, rubs or gallops, no edema, Respiratory: Normal respiratory effort on room air, lungs clear to auscultation bilaterally with no wheezing Abdomen: soft, non-distended, non-tender, normal bowel sounds Skin: diffuse dry skin and hyperpigmentation consistent with exzema Neurologic: Grossly no focal neuro deficit.Mental status AAOx3, speech normal, Psychiatric:Appropriate affect, and mood   Discharge Instructions You were cared for by a hospitalist during your hospital stay. If you have any questions about your discharge medications or the care you received while you were in the hospital after you are discharged, you can call the unit and asked to speak with the hospitalist on call if the hospitalist that took care of you is not available. Once you are discharged, your primary care physician will handle any further medical issues. Please note that NO REFILLS for any discharge medications will be authorized  once you are discharged, as it is imperative that you return to your primary care physician (or establish a relationship with a primary care physician if you do not have one) for your aftercare needs so that they can reassess your need for  medications and monitor your lab values.  Discharge Instructions    Diet - low sodium heart healthy   Complete by:  As directed    Increase activity slowly   Complete by:  As directed      Allergies as of 06/11/2018      Reactions   Tylenol [acetaminophen] Hives, Rash      Medication List    TAKE these medications   albuterol 108 (90 Base) MCG/ACT inhaler Commonly known as:  PROVENTIL HFA;VENTOLIN HFA Inhale 2 puffs into the lungs every 4 (four) hours as needed for wheezing or shortness of breath. What changed:  Another medication with the same name was removed. Continue taking this medication, and follow the directions you see here.   predniSONE 20 MG tablet Commonly known as:  DELTASONE Take 2 tablets (40 mg total) by mouth daily with breakfast for 4 days. Start taking on:  06/12/2018      Allergies  Allergen Reactions  . Tylenol [Acetaminophen] Hives and Rash   Follow-up Information    [facility] Follow up in 1 week(s).   Contact information: [address] [place] [address] [phone]           The results of significant diagnostics from this hospitalization (including imaging, microbiology, ancillary and laboratory) are listed below for reference.    Significant Diagnostic Studies: Dg Chest Port 1 View  Result Date: 06/10/2018 CLINICAL DATA:  Respiratory distress EXAM: PORTABLE CHEST 1 VIEW COMPARISON:  04/27/2018 FINDINGS: Mild diffuse interstitial opacity. No pleural effusion. Normal heart size. No pneumothorax. IMPRESSION: Mild diffuse interstitial opacity, could be secondary to reactive airways or interstitial inflammatory process. No focal airspace disease. Electronically Signed   By: Jasmine Pang M.D.   On: 06/10/2018 19:10    Microbiology: No results found for this or any previous visit (from the past 240 hour(s)).   Labs: Basic Metabolic Panel: Recent Labs  Lab 06/10/18 1848  NA 141    K 3.8  CL 107  CO2 25  GLUCOSE 77  BUN 13  CREATININE 0.87  CALCIUM 9.3   Liver Function Tests: No results for input(s): AST, ALT, ALKPHOS, BILITOT, PROT, ALBUMIN in the last 168 hours. No results for input(s): LIPASE, AMYLASE in the last 168 hours. No results for input(s): AMMONIA in the last 168 hours. CBC: Recent Labs  Lab 06/10/18 1848  WBC 9.8  HGB 14.9  HCT 45.5  MCV 89.0  PLT 321   Cardiac Enzymes: No results for input(s): CKTOTAL, CKMB, CKMBINDEX, TROPONINI in the last 168 hours. BNP: BNP (last 3 results) Recent Labs    06/10/18 1849  BNP 22.6    ProBNP (last 3 results) No results for input(s): PROBNP in the last 8760 hours.  CBG: No results for input(s): GLUCAP in the last 168 hours.     Signed:  Laverna Peace, MD [facility] 06/11/2018, 8:55 AM

## 2018-07-10 ENCOUNTER — Other Ambulatory Visit: Payer: Self-pay

## 2018-07-10 ENCOUNTER — Encounter (HOSPITAL_COMMUNITY): Payer: Self-pay | Admitting: *Deleted

## 2018-07-10 ENCOUNTER — Emergency Department (HOSPITAL_COMMUNITY)
Admission: EM | Admit: 2018-07-10 | Discharge: 2018-07-11 | Payer: Medicaid Other | Attending: Emergency Medicine | Admitting: Emergency Medicine

## 2018-07-10 DIAGNOSIS — F10129 Alcohol abuse with intoxication, unspecified: Secondary | ICD-10-CM | POA: Insufficient documentation

## 2018-07-10 DIAGNOSIS — J45909 Unspecified asthma, uncomplicated: Secondary | ICD-10-CM | POA: Insufficient documentation

## 2018-07-10 DIAGNOSIS — F10920 Alcohol use, unspecified with intoxication, uncomplicated: Secondary | ICD-10-CM

## 2018-07-10 DIAGNOSIS — F17210 Nicotine dependence, cigarettes, uncomplicated: Secondary | ICD-10-CM | POA: Insufficient documentation

## 2018-07-10 LAB — I-STAT CHEM 8, ED
BUN: 6 mg/dL (ref 6–20)
CALCIUM ION: 0.95 mmol/L — AB (ref 1.15–1.40)
Chloride: 107 mmol/L (ref 98–111)
Creatinine, Ser: 1.2 mg/dL (ref 0.61–1.24)
Glucose, Bld: 109 mg/dL — ABNORMAL HIGH (ref 70–99)
HCT: 47 % (ref 39.0–52.0)
HEMOGLOBIN: 16 g/dL (ref 13.0–17.0)
Potassium: 3.5 mmol/L (ref 3.5–5.1)
SODIUM: 142 mmol/L (ref 135–145)
TCO2: 26 mmol/L (ref 22–32)

## 2018-07-10 LAB — ETHANOL: Alcohol, Ethyl (B): 248 mg/dL — ABNORMAL HIGH (ref <10)

## 2018-07-10 NOTE — ED Triage Notes (Signed)
EMS brought in pt after being called out for bizarre behavior. Sheriffs reported pt was destroying mailboxes on their arrival and combative. Pt admitted to drinking [employer] x4 today. EMS gave 4mg  zofran enroute then pt slept on the way in. Initially in handcuffs that were removed on arrival, abrasions noted to R fingers. EMS attempted NPA that pt refused

## 2018-07-11 ENCOUNTER — Inpatient Hospital Stay (HOSPITAL_COMMUNITY)
Admission: AD | Admit: 2018-07-11 | Discharge: 2018-07-22 | DRG: 881 | Disposition: A | Discharge status: Home / Self Care | Payer: Federal, State, Local not specified - Other | Source: Intra-hospital | Attending: Psychiatry | Admitting: Psychiatry

## 2018-07-11 ENCOUNTER — Emergency Department (HOSPITAL_COMMUNITY)
Admission: EM | Admit: 2018-07-11 | Discharge: 2018-07-11 | Disposition: A | Discharge status: Other Acute Inpatient Hospital | Payer: Self-pay | Attending: Emergency Medicine | Admitting: Emergency Medicine

## 2018-07-11 DIAGNOSIS — Z9114 Patient's other noncompliance with medication regimen: Secondary | ICD-10-CM

## 2018-07-11 DIAGNOSIS — Z716 Tobacco abuse counseling: Secondary | ICD-10-CM

## 2018-07-11 DIAGNOSIS — F1020 Alcohol dependence, uncomplicated: Secondary | ICD-10-CM | POA: Insufficient documentation

## 2018-07-11 DIAGNOSIS — R451 Restlessness and agitation: Secondary | ICD-10-CM | POA: Diagnosis not present

## 2018-07-11 DIAGNOSIS — F17210 Nicotine dependence, cigarettes, uncomplicated: Secondary | ICD-10-CM | POA: Diagnosis present

## 2018-07-11 DIAGNOSIS — Z886 Allergy status to analgesic agent status: Secondary | ICD-10-CM | POA: Diagnosis not present

## 2018-07-11 DIAGNOSIS — R45851 Suicidal ideations: Secondary | ICD-10-CM | POA: Insufficient documentation

## 2018-07-11 DIAGNOSIS — F315 Bipolar disorder, current episode depressed, severe, with psychotic features: Secondary | ICD-10-CM | POA: Diagnosis not present

## 2018-07-11 DIAGNOSIS — Z888 Allergy status to other drugs, medicaments and biological substances status: Secondary | ICD-10-CM | POA: Diagnosis not present

## 2018-07-11 DIAGNOSIS — L309 Dermatitis, unspecified: Secondary | ICD-10-CM | POA: Diagnosis present

## 2018-07-11 DIAGNOSIS — F419 Anxiety disorder, unspecified: Secondary | ICD-10-CM | POA: Diagnosis present

## 2018-07-11 DIAGNOSIS — F22 Delusional disorders: Secondary | ICD-10-CM | POA: Diagnosis present

## 2018-07-11 DIAGNOSIS — J45909 Unspecified asthma, uncomplicated: Secondary | ICD-10-CM | POA: Insufficient documentation

## 2018-07-11 DIAGNOSIS — F323 Major depressive disorder, single episode, severe with psychotic features: Secondary | ICD-10-CM | POA: Insufficient documentation

## 2018-07-11 DIAGNOSIS — F10239 Alcohol dependence with withdrawal, unspecified: Secondary | ICD-10-CM | POA: Diagnosis not present

## 2018-07-11 DIAGNOSIS — F329 Major depressive disorder, single episode, unspecified: Principal | ICD-10-CM | POA: Diagnosis present

## 2018-07-11 DIAGNOSIS — Z915 Personal history of self-harm: Secondary | ICD-10-CM | POA: Diagnosis not present

## 2018-07-11 DIAGNOSIS — F319 Bipolar disorder, unspecified: Secondary | ICD-10-CM | POA: Diagnosis present

## 2018-07-11 DIAGNOSIS — F1010 Alcohol abuse, uncomplicated: Secondary | ICD-10-CM | POA: Insufficient documentation

## 2018-07-11 DIAGNOSIS — G4700 Insomnia, unspecified: Secondary | ICD-10-CM | POA: Diagnosis present

## 2018-07-11 DIAGNOSIS — Z590 Homelessness: Secondary | ICD-10-CM | POA: Diagnosis not present

## 2018-07-11 DIAGNOSIS — Z6841 Body Mass Index (BMI) 40.0 and over, adult: Secondary | ICD-10-CM | POA: Diagnosis not present

## 2018-07-11 DIAGNOSIS — F332 Major depressive disorder, recurrent severe without psychotic features: Secondary | ICD-10-CM | POA: Diagnosis not present

## 2018-07-11 LAB — CBC WITH DIFFERENTIAL/PLATELET
ABS IMMATURE GRANULOCYTES: 0.04 10*3/uL (ref 0.00–0.07)
Basophils Absolute: 0.1 10*3/uL (ref 0.0–0.1)
Basophils Relative: 1 %
Eosinophils Absolute: 0.2 10*3/uL (ref 0.0–0.5)
Eosinophils Relative: 3 %
HCT: 48 % (ref 39.0–52.0)
HEMOGLOBIN: 15.4 g/dL (ref 13.0–17.0)
IMMATURE GRANULOCYTES: 1 %
LYMPHS PCT: 19 %
Lymphs Abs: 1.7 10*3/uL (ref 0.7–4.0)
MCH: 29.2 pg (ref 26.0–34.0)
MCHC: 32.1 g/dL (ref 30.0–36.0)
MCV: 90.9 fL (ref 80.0–100.0)
MONO ABS: 0.6 10*3/uL (ref 0.1–1.0)
Monocytes Relative: 6 %
NEUTROS ABS: 6.3 10*3/uL (ref 1.7–7.7)
NEUTROS PCT: 70 %
Platelets: 300 10*3/uL (ref 150–400)
RBC: 5.28 MIL/uL (ref 4.22–5.81)
RDW: 13.6 % (ref 11.5–15.5)
WBC: 8.9 10*3/uL (ref 4.0–10.5)
nRBC: 0 % (ref 0.0–0.2)

## 2018-07-11 LAB — COMPREHENSIVE METABOLIC PANEL
ALT: 38 U/L (ref 0–44)
AST: 29 U/L (ref 15–41)
Albumin: 4.2 g/dL (ref 3.5–5.0)
Alkaline Phosphatase: 62 U/L (ref 38–126)
Anion gap: 9 (ref 5–15)
BUN: 11 mg/dL (ref 6–20)
CHLORIDE: 106 mmol/L (ref 98–111)
CO2: 28 mmol/L (ref 22–32)
CREATININE: 1 mg/dL (ref 0.61–1.24)
Calcium: 8.5 mg/dL — ABNORMAL LOW (ref 8.9–10.3)
GFR calc non Af Amer: >60 mL/min (ref >60)
Glucose, Bld: 102 mg/dL — ABNORMAL HIGH (ref 70–99)
Potassium: 3.8 mmol/L (ref 3.5–5.1)
SODIUM: 143 mmol/L (ref 135–145)
Total Bilirubin: 0.5 mg/dL (ref 0.3–1.2)
Total Protein: 8.1 g/dL (ref 6.5–8.1)

## 2018-07-11 LAB — ACETAMINOPHEN LEVEL: Acetaminophen (Tylenol), Serum: <10 ug/mL — ABNORMAL LOW (ref 10–30)

## 2018-07-11 LAB — SALICYLATE LEVEL

## 2018-07-11 LAB — ETHANOL: Alcohol, Ethyl (B): <10 mg/dL (ref <10)

## 2018-07-11 MED ORDER — VITAMIN B-1 100 MG PO TABS
100.0000 mg | ORAL_TABLET | Freq: Every day | ORAL | Status: DC
  Administered 2018-07-11: 100 mg via ORAL
  Filled 2018-07-11: qty 1

## 2018-07-11 MED ORDER — LORAZEPAM 2 MG/ML IJ SOLN: 0.0000 mg | Freq: Two times a day (BID) | INTRAMUSCULAR | Status: DC

## 2018-07-11 MED ORDER — ALUM & MAG HYDROXIDE-SIMETH 200-200-20 MG/5ML PO SUSP: 30.0000 mL | Freq: Four times a day (QID) | ORAL | Status: DC | PRN

## 2018-07-11 MED ORDER — ONDANSETRON 4 MG PO TBDP
4.0000 mg | ORAL_TABLET | Freq: Once | ORAL | Status: AC
  Administered 2018-07-11: 4 mg via ORAL
  Filled 2018-07-11: qty 1

## 2018-07-11 MED ORDER — ONDANSETRON HCL 4 MG PO TABS: 4.0000 mg | ORAL_TABLET | Freq: Three times a day (TID) | ORAL | Status: DC | PRN

## 2018-07-11 MED ORDER — THIAMINE HCL 100 MG/ML IJ SOLN: 100.0000 mg | Freq: Every day | INTRAMUSCULAR | Status: DC

## 2018-07-11 MED ORDER — LORAZEPAM 1 MG PO TABS: 0.0000 mg | ORAL_TABLET | Freq: Two times a day (BID) | ORAL | Status: DC

## 2018-07-11 MED ORDER — LORAZEPAM 2 MG/ML IJ SOLN: 0.0000 mg | Freq: Four times a day (QID) | INTRAMUSCULAR | Status: DC

## 2018-07-11 MED ORDER — LORAZEPAM 1 MG PO TABS
0.0000 mg | ORAL_TABLET | Freq: Four times a day (QID) | ORAL | Status: DC
  Administered 2018-07-11: 1 mg via ORAL
  Filled 2018-07-11: qty 1
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
## 2018-07-11 NOTE — ED Notes (Signed)
Patient calm and cooperative.  Interaction with staff is appropriate.
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
## 2018-07-11 NOTE — ED Triage Notes (Signed)
Transported by [facility] from jail-- + SI. Patient admits to drinking of alcohol last night after being sober for 9 months. AAO x4. Hx of bipolar and depression (not currently on medication.)

## 2018-07-11 NOTE — ED Notes (Signed)
Patient reports he has relapsed after 9 months of sobriety.  He reports also feeling suicidal without a plan and also that he has been feeling paranoid like other people are talking about him.  He reports he needs something other than alcohol to take the paranoid feelings  away.  Alert and oriented x 2.  Admits to drinking last night to the point that he cannot remember anything.

## 2018-07-11 NOTE — ED Notes (Signed)
Bed: [facility] Expected date:  Expected time:  Means of arrival:  Comments: EMS SI 

## 2018-07-11 NOTE — Progress Notes (Signed)
VOL paperwork signed and [facility] to [facility] for [facility].

## 2018-07-11 NOTE — ED Notes (Signed)
[facility].

## 2018-07-11 NOTE — Progress Notes (Signed)
Per [name], PA pt meets criteria for inpt treatment. EDP [name], [name], PA-C and pt's nurse have been advised. [facility] to review for possible admission.  Princess BruinsAquicha Lamiah Marmol, MSW, LCSW Therapeutic Triage Specialist  [phone]

## 2018-07-11 NOTE — Progress Notes (Signed)
[facility], room 306/2.  Service of Dr. Jama Flavorsobos, MD.  Rosey BathKelly Cailee Blanke, RN

## 2018-07-11 NOTE — ED Notes (Signed)
ED Provider at bedside. 

## 2018-07-12 ENCOUNTER — Encounter (HOSPITAL_COMMUNITY): Payer: Self-pay | Admitting: *Deleted

## 2018-07-12 ENCOUNTER — Other Ambulatory Visit: Payer: Self-pay

## 2018-07-12 DIAGNOSIS — F419 Anxiety disorder, unspecified: Secondary | ICD-10-CM

## 2018-07-12 DIAGNOSIS — F10239 Alcohol dependence with withdrawal, unspecified: Secondary | ICD-10-CM

## 2018-07-12 DIAGNOSIS — F319 Bipolar disorder, unspecified: Secondary | ICD-10-CM | POA: Diagnosis present

## 2018-07-12 DIAGNOSIS — F315 Bipolar disorder, current episode depressed, severe, with psychotic features: Secondary | ICD-10-CM

## 2018-07-12 DIAGNOSIS — G4700 Insomnia, unspecified: Secondary | ICD-10-CM

## 2018-07-12 MED ORDER — ALBUTEROL SULFATE HFA 108 (90 BASE) MCG/ACT IN AERS
INHALATION_SPRAY | RESPIRATORY_TRACT | Status: AC
  Administered 2018-07-12: 2 via RESPIRATORY_TRACT
  Filled 2018-07-12: qty 6.7

## 2018-07-12 MED ORDER — TRAZODONE HCL 50 MG PO TABS
50.0000 mg | ORAL_TABLET | Freq: Every evening | ORAL | Status: DC | PRN
  Filled 2018-07-12: qty 7

## 2018-07-12 MED ORDER — QUETIAPINE FUMARATE 25 MG PO TABS
25.0000 mg | ORAL_TABLET | Freq: Two times a day (BID) | ORAL | Status: DC
  Administered 2018-07-12 – 2018-07-22 (×20): 25 mg via ORAL
  Filled 2018-07-12 (×24): qty 1

## 2018-07-12 MED ORDER — LORAZEPAM 1 MG PO TABS
1.0000 mg | ORAL_TABLET | ORAL | Status: AC | PRN
  Administered 2018-07-12: 1 mg via ORAL

## 2018-07-12 MED ORDER — ALUM & MAG HYDROXIDE-SIMETH 200-200-20 MG/5ML PO SUSP: 30.0000 mL | ORAL | Status: DC | PRN

## 2018-07-12 MED ORDER — NICOTINE 21 MG/24HR TD PT24
21.0000 mg | MEDICATED_PATCH | Freq: Every day | TRANSDERMAL | Status: DC
  Administered 2018-07-12 – 2018-07-14 (×2): 21 mg via TRANSDERMAL
  Filled 2018-07-12 (×8): qty 1

## 2018-07-12 MED ORDER — ALBUTEROL SULFATE HFA 108 (90 BASE) MCG/ACT IN AERS
2.0000 | INHALATION_SPRAY | RESPIRATORY_TRACT | Status: DC | PRN
  Administered 2018-07-12 – 2018-07-20 (×11): 2 via RESPIRATORY_TRACT
  Filled 2018-07-12 (×2): qty 6.7

## 2018-07-12 MED ORDER — THIAMINE HCL 100 MG/ML IJ SOLN: 100.0000 mg | Freq: Once | INTRAMUSCULAR | Status: DC

## 2018-07-12 MED ORDER — QUETIAPINE FUMARATE 100 MG PO TABS
100.0000 mg | ORAL_TABLET | Freq: Every day | ORAL | Status: DC
  Administered 2018-07-12 – 2018-07-13 (×2): 100 mg via ORAL
  Filled 2018-07-12 (×5): qty 1

## 2018-07-12 MED ORDER — GABAPENTIN 600 MG PO TABS
300.0000 mg | ORAL_TABLET | Freq: Three times a day (TID) | ORAL | Status: DC
  Filled 2018-07-12 (×6): qty 0.5

## 2018-07-12 MED ORDER — HYDROXYZINE HCL 50 MG PO TABS
50.0000 mg | ORAL_TABLET | Freq: Four times a day (QID) | ORAL | Status: AC | PRN
  Administered 2018-07-12 – 2018-07-13 (×2): 50 mg via ORAL
  Filled 2018-07-12 (×2): qty 1

## 2018-07-12 MED ORDER — LOPERAMIDE HCL 2 MG PO CAPS: 2.0000 mg | ORAL_CAPSULE | ORAL | Status: AC | PRN

## 2018-07-12 MED ORDER — LORAZEPAM 1 MG PO TABS: 1.0000 mg | ORAL_TABLET | Freq: Every day | ORAL | Status: DC

## 2018-07-12 MED ORDER — GABAPENTIN 100 MG PO CAPS
100.0000 mg | ORAL_CAPSULE | Freq: Three times a day (TID) | ORAL | Status: DC
  Administered 2018-07-12 – 2018-07-14 (×6): 100 mg via ORAL
  Filled 2018-07-12 (×12): qty 1

## 2018-07-12 MED ORDER — BUPROPION HCL ER (SR) 100 MG PO TB12
100.0000 mg | ORAL_TABLET | Freq: Every day | ORAL | Status: DC
  Administered 2018-07-13 – 2018-07-14 (×2): 100 mg via ORAL
  Filled 2018-07-12 (×4): qty 1

## 2018-07-12 MED ORDER — LORAZEPAM 1 MG PO TABS: 1.0000 mg | ORAL_TABLET | Freq: Three times a day (TID) | ORAL | Status: DC

## 2018-07-12 MED ORDER — ONDANSETRON 4 MG PO TBDP: 4.0000 mg | ORAL_TABLET | Freq: Four times a day (QID) | ORAL | Status: AC | PRN

## 2018-07-12 MED ORDER — GABAPENTIN 300 MG PO CAPS
300.0000 mg | ORAL_CAPSULE | Freq: Three times a day (TID) | ORAL | Status: DC
  Administered 2018-07-12: 300 mg via ORAL
  Filled 2018-07-12 (×4): qty 1

## 2018-07-12 MED ORDER — LORAZEPAM 1 MG PO TABS: 1.0000 mg | ORAL_TABLET | Freq: Two times a day (BID) | ORAL | Status: DC

## 2018-07-12 MED ORDER — ADULT MULTIVITAMIN W/MINERALS CH
1.0000 | ORAL_TABLET | Freq: Every day | ORAL | Status: DC
  Administered 2018-07-12 – 2018-07-22 (×11): 1 via ORAL
  Filled 2018-07-12 (×14): qty 1

## 2018-07-12 MED ORDER — IBUPROFEN 800 MG PO TABS
800.0000 mg | ORAL_TABLET | Freq: Three times a day (TID) | ORAL | Status: DC | PRN
  Administered 2018-07-12 – 2018-07-21 (×5): 800 mg via ORAL
  Filled 2018-07-12 (×5): qty 1

## 2018-07-12 MED ORDER — LORAZEPAM 1 MG PO TABS
1.0000 mg | ORAL_TABLET | Freq: Four times a day (QID) | ORAL | Status: DC
  Filled 2018-07-12: qty 1

## 2018-07-12 MED ORDER — ZIPRASIDONE MESYLATE 20 MG IM SOLR: 20.0000 mg | INTRAMUSCULAR | Status: DC | PRN

## 2018-07-12 MED ORDER — OLANZAPINE 5 MG PO TBDP
5.0000 mg | ORAL_TABLET | Freq: Three times a day (TID) | ORAL | Status: DC | PRN
  Administered 2018-07-12 – 2018-07-21 (×4): 5 mg via ORAL
  Filled 2018-07-12 (×4): qty 1

## 2018-07-12 MED ORDER — VITAMIN B-1 100 MG PO TABS
100.0000 mg | ORAL_TABLET | Freq: Every day | ORAL | Status: DC
  Administered 2018-07-13 – 2018-07-22 (×10): 100 mg via ORAL
  Filled 2018-07-12 (×12): qty 1

## 2018-07-12 MED ORDER — BUPROPION HCL ER (XL) 150 MG PO TB24
150.0000 mg | ORAL_TABLET | Freq: Every day | ORAL | Status: DC
  Administered 2018-07-12: 150 mg via ORAL
  Filled 2018-07-12 (×4): qty 1

## 2018-07-12 MED ORDER — LORAZEPAM 1 MG PO TABS
1.0000 mg | ORAL_TABLET | Freq: Four times a day (QID) | ORAL | Status: AC | PRN
  Administered 2018-07-13 – 2018-07-14 (×3): 1 mg via ORAL
  Filled 2018-07-12 (×4): qty 1

## 2018-07-12 MED ORDER — HYDROXYZINE HCL 25 MG PO TABS: 25.0000 mg | ORAL_TABLET | Freq: Four times a day (QID) | ORAL | Status: DC | PRN

## 2018-07-12 MED ORDER — MAGNESIUM HYDROXIDE 400 MG/5ML PO SUSP: 30.0000 mL | Freq: Every day | ORAL | Status: DC | PRN
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
## 2018-07-12 NOTE — [facility] Group Notes (Signed)
[facility] Group Notes:  (Nursing)  Date:  07/12/2018  Time: 130 PM Type of Therapy:  Nurse Education  Participation Level:  Active  Participation Quality:  Attentive  Affect:  Appropriate  Cognitive:  Appropriate  Insight:  Appropriate  Engagement in Group:  Engaged  Modes of Intervention:  Discussion and Education  Summary of Progress/Problems: Nurse led group played a noncompetitive learning/communication board game that fosters listening skills as well as self expression.  Shela NevinValerie S Jedadiah Abdallah 07/12/2018, 4:34 PM
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
## 2018-07-12 NOTE — Progress Notes (Signed)
D. Pt visible in the milieu, attending groups, playing cards with peers. Pt has been friendly upon approach- interacts appropriately with peers and staff.  Pt endorses passive SI with no plan- agrees to contact staff before acting on any harmful thoughts. Pt reports that he is sometimes overwhelmed with 'voices' and states that he occasional feels as though he  "hears messages on the tv". A. Labs and vitals monitored. Pt compliant with medications. Pt supported emotionally and encouraged to express concerns and ask questions.   R. Pt remains safe with 15 minute checks. Will continue POC.
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
## 2018-07-12 NOTE — Tx Team (Signed)
Initial Treatment Plan 07/12/2018 12:52 AM Ichael Ashley AkinKirkpatrick Jamison EAV:[id number]    PATIENT STRESSORS: Financial difficulties Legal issue Loss of girlfriend (break up) Occupational concerns Substance abuse   PATIENT STRENGTHS: Ability for insight Communication skills General fund of knowledge Physical Health   PATIENT IDENTIFIED PROBLEMS: "how not to get mad"   "how to handle my emotions"     SI  Substance abuse             DISCHARGE CRITERIA:  Improved stabilization in mood, thinking, and/or behavior Motivation to continue treatment in a less acute level of care Need for constant or close observation no longer present  PRELIMINARY DISCHARGE PLAN: Attend 12-step recovery group Outpatient therapy  PATIENT/FAMILY INVOLVEMENT: This treatment plan has been presented to and reviewed with the patient, Shan LevansSonn Kirkpatrick Reichow.  The patient and family have been given the opportunity to ask questions and make suggestions.  Arrie AranChurch, Alessandria Henken J, [place]RN 07/12/2018, 12:52 AM

## 2018-07-13 LAB — LIPID PANEL
Cholesterol: 148 mg/dL (ref 0–200)
HDL: 52 mg/dL (ref >40)
LDL CALC: 82 mg/dL (ref 0–99)
Total CHOL/HDL Ratio: 2.8 RATIO
Triglycerides: 71 mg/dL (ref <150)
VLDL: 14 mg/dL (ref 0–40)

## 2018-07-13 LAB — TSH: TSH: 4.776 u[IU]/mL — AB (ref 0.350–4.500)

## 2018-07-13 NOTE — Progress Notes (Signed)
D: Patient is cooperative. Patient presents with incoherent speech for parts of the assessment by this RN. Patient endorses auditory hallucinations. Patient is mumbling and incoherent when asked to describe what he is hearing. Patient denies SI, HI, and verbally contracts for safety. Patient reports anxiety and requests PRN anxiety medication. Patient reports stomach upset. Patient refuses medication for stomach.   Patient woke early in the evening and reports upset stomach and requests ginger ale and graham crackers. Patient is incoherent for much of this interaction. Patient appears unsteady and mildly confused during this interaction. Patient given ginger ale and graham crackers and escorted back to bed.   A: Scheduled medications administered per MD order. PRN anxiety medication administered per MD order. Support provided. Patient educated on safety on the unit and medications. Routine safety checks every 15 minutes. Patient stated understanding to tell nurse about any new physical symptoms. Patient understands to tell staff of any needs.     R: No adverse drug reactions noted. Patient verbally contracts for safety. Patient remains safe at this time and will continue to monitor.

## 2018-07-13 NOTE — Plan of Care (Signed)
  Problem: Education: Goal: Knowledge of [place] [facility] Education information/materials will improve Outcome: Progressing   Problem: Safety: Goal: Periods of time without injury will increase Outcome: Progressing   Patient oriented to the unit. Patient remains safe and will continue to monitor.  
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
## 2018-07-13 NOTE — Progress Notes (Signed)
[facility] MD Progress Note  07/13/2018 8:08 AM Micheal Kirk  MRN:  [id number] Subjective:   Patient is in bed states he feels hung over from his medications reports auditory hallucinations described as chiefly inside his head not outside his head Was admitted in the context of an alcoholic blackout destruction of property is now homeless is blaming himself for his relapse denies wanting to harm self today contracting here Principal Problem: Bipolar disorder, unspecified ([place]) Diagnosis: Substance-induced mood disorder/alcohol abuse and intoxication leading to blackout/probable issues of secondary gain Patient Active Problem List   Diagnosis Date Noted  . Bipolar disorder, unspecified ([place]) [F31.9] 07/12/2018  . Status asthmaticus [J45.902] 06/11/2018  . Eczema [L30.9] 06/11/2018  . Asthma, chronic, unspecified asthma severity, with acute exacerbation [J45.901] 06/11/2018   Total Time spent with patient: 20 minutes  Past Medical History:  Past Medical History:  Diagnosis Date  . Asthma   . Eczema     Past Surgical History:  Procedure Laterality Date  . NASAL SEPTUM SURGERY     Family History: History reviewed. No pertinent family history. Social History:  Social History   Substance and Sexual Activity  Alcohol Use Not Currently     Social History   Substance and Sexual Activity  Drug Use Not Currently    Social History   Socioeconomic History  . Marital status: Single    Spouse name: Not on file  . Number of children: Not on file  . Years of education: Not on file  . Highest education level: Not on file  Occupational History  . Not on file  Social Needs  . Financial resource strain: Not on file  . Food insecurity:    Worry: Not on file    Inability: Not on file  . Transportation needs:    Medical: Not on file    Non-medical: Not on file  Tobacco Use  . Smoking status: Current Every Day Smoker    Packs/day: 0.50  . Smokeless tobacco: Never Used  Substance  and Sexual Activity  . Alcohol use: Not Currently  . Drug use: Not Currently  . Sexual activity: Not on file  Lifestyle  . Physical activity:    Days per week: Not on file    Minutes per session: Not on file  . Stress: Not on file  Relationships  . Social connections:    Talks on phone: Not on file    Gets together: Not on file    Attends religious service: Not on file    Active member of club or organization: Not on file    Attends meetings of clubs or organizations: Not on file    Relationship status: Not on file  Other Topics Concern  . Not on file  Social History Narrative  . Not on file   Additional Social History:    Pain Medications: denies Prescriptions: denies Over the Counter: denies History of alcohol / drug use?: Yes Longest period of sobriety (when/how long): 9 months Negative Consequences of Use: Legal, Personal relationships Withdrawal Symptoms: Patient aware of relationship between substance abuse and physical/medical complications(anxious) Name of Substance 1: alcohol 1 - Age of First Use: 12 1 - Amount (size/oz): varies 1 - Frequency: relapsed on 07/10/18 1 - Duration: on-going 1 - Last Use / Amount: 07/10/18                  Sleep: Good  Appetite:  Good  Current Medications: Current Facility-Administered Medications  Medication Dose Route Frequency Provider  Last Rate Last Dose  . albuterol (PROVENTIL HFA;VENTOLIN HFA) 108 (90 Base) MCG/ACT inhaler 2 puff  2 puff Inhalation Q4H PRN Cobos, Myer Peer, MD   2 puff at 07/12/18 1616  . alum & mag hydroxide-simeth (MAALOX/MYLANTA) 200-200-20 MG/5ML suspension 30 mL  30 mL Oral Q4H PRN Laverle Hobby, PA-C      . buPROPion [facility] SR) 12 hr tablet 100 mg  100 mg Oral Daily Cobos, Fernando A, MD      . gabapentin (NEURONTIN) capsule 100 mg  100 mg Oral TID Cobos, Myer Peer, MD   100 mg at 07/12/18 1718  . hydrOXYzine (ATARAX/VISTARIL) tablet 50 mg  50 mg Oral Q6H PRN Money, Lowry Ram, FNP   50  mg at 07/12/18 1033  . ibuprofen (ADVIL,MOTRIN) tablet 800 mg  800 mg Oral Q8H PRN Money, Lowry Ram, FNP   800 mg at 07/12/18 1033  . loperamide (IMODIUM) capsule 2-4 mg  2-4 mg Oral PRN Laverle Hobby, PA-C      . LORazepam (ATIVAN) tablet 1 mg  1 mg Oral Q6H PRN Patriciaann Clan E, PA-C      . magnesium hydroxide (MILK OF MAGNESIA) suspension 30 mL  30 mL Oral Daily PRN Laverle Hobby, PA-C      . multivitamin with minerals tablet 1 tablet  1 tablet Oral Daily Laverle Hobby, PA-C   1 tablet at 07/12/18 7096  . nicotine (NICODERM CQ - dosed in mg/24 hours) patch 21 mg  21 mg Transdermal Daily Cobos, Myer Peer, MD   21 mg at 07/12/18 0928  . OLANZapine zydis (ZYPREXA) disintegrating tablet 5 mg  5 mg Oral Q8H PRN Cobos, Myer Peer, MD   5 mg at 07/12/18 1453   And  . ziprasidone (GEODON) injection 20 mg  20 mg Intramuscular PRN Cobos, Myer Peer, MD      . ondansetron (ZOFRAN-ODT) disintegrating tablet 4 mg  4 mg Oral Q6H PRN Laverle Hobby, PA-C      . QUEtiapine (SEROQUEL) tablet 100 mg  100 mg Oral QHS Money, Travis B, FNP   100 mg at 07/12/18 2147  . QUEtiapine (SEROQUEL) tablet 25 mg  25 mg Oral BID Cobos, Myer Peer, MD   25 mg at 07/12/18 1718  . thiamine (B-1) injection 100 mg  100 mg Intramuscular Once Laverle Hobby, PA-C      . thiamine (VITAMIN B-1) tablet 100 mg  100 mg Oral Daily Simon, Spencer E, PA-C      . traZODone (DESYREL) tablet 50 mg  50 mg Oral QHS PRN Laverle Hobby, PA-C        Lab Results:  Results for orders placed or performed during the hospital encounter of 07/11/18 (from the past 48 hour(s))  Comprehensive metabolic panel     Status: Abnormal   Collection Time: 07/11/18  5:00 PM  Result Value Ref Range   Sodium 143 135 - 145 mmol/L   Potassium 3.8 3.5 - 5.1 mmol/L   Chloride 106 98 - 111 mmol/L   CO2 28 22 - 32 mmol/L   Glucose, Bld 102 (H) 70 - 99 mg/dL   BUN 11 6 - 20 mg/dL   Creatinine, Ser 1.00 0.61 - 1.24 mg/dL   Calcium 8.5 (L) 8.9 - 10.3  mg/dL   Total Protein 8.1 6.5 - 8.1 g/dL   Albumin 4.2 3.5 - 5.0 g/dL   AST 29 15 - 41 U/L   ALT 38 0 - 44 U/L  Alkaline Phosphatase 62 38 - 126 U/L   Total Bilirubin 0.5 0.3 - 1.2 mg/dL   GFR calc non Af Amer >60 >60 mL/min   GFR calc Af Amer >60 >60 mL/min    Comment: (NOTE) The eGFR has been calculated using the CKD EPI equation. This calculation has not been validated in all clinical situations. eGFR's persistently <60 mL/min signify possible Chronic Kidney Disease.    Anion gap 9 5 - 15    Comment: Performed at [facility], [place] [address] [place], [place] [address]  Ethanol     Status: None   Collection Time: 07/11/18  5:00 PM  Result Value Ref Range   Alcohol, Ethyl (B) <10 <10 mg/dL    Comment: (NOTE) Lowest detectable limit for serum alcohol is 10 mg/dL. For medical purposes only. Performed at [facility], [place] [address] [place], [place] [address]   CBC with Diff     Status: None   Collection Time: 07/11/18  5:00 PM  Result Value Ref Range   WBC 8.9 4.0 - 10.5 K/uL   RBC 5.28 4.22 - 5.81 MIL/uL   Hemoglobin 15.4 13.0 - 17.0 g/dL   HCT 48.0 39.0 - 52.0 %   MCV 90.9 80.0 - 100.0 fL   MCH 29.2 26.0 - 34.0 pg   MCHC 32.1 30.0 - 36.0 g/dL   RDW 13.6 11.5 - 15.5 %   Platelets 300 150 - 400 K/uL   nRBC 0.0 0.0 - 0.2 %   Neutrophils Relative % 70 %   Neutro Abs 6.3 1.7 - 7.7 K/uL   Lymphocytes Relative 19 %   Lymphs Abs 1.7 0.7 - 4.0 K/uL   Monocytes Relative 6 %   Monocytes Absolute 0.6 0.1 - 1.0 K/uL   Eosinophils Relative 3 %   Eosinophils Absolute 0.2 0.0 - 0.5 K/uL   Basophils Relative 1 %   Basophils Absolute 0.1 0.0 - 0.1 K/uL   Immature Granulocytes 1 %   Abs Immature Granulocytes 0.04 0.00 - 0.07 K/uL    Comment: Performed at [facility], [place] [address] [place], [place] [address]  Acetaminophen level     Status: Abnormal   Collection Time: 07/11/18  5:00 PM  Result Value Ref Range    Acetaminophen (Tylenol), Serum <10 (L) 10 - 30 ug/mL    Comment: (NOTE) Therapeutic concentrations vary significantly. A range of 10-30 ug/mL  may be an effective concentration for many patients. However, some  are best treated at concentrations outside of this range. Acetaminophen concentrations >150 ug/mL at 4 hours after ingestion  and >50 ug/mL at 12 hours after ingestion are often associated with  toxic reactions. Performed at [facility], [place] [address] [place], [place] [address]   Salicylate level     Status: None   Collection Time: 07/11/18  5:00 PM  Result Value Ref Range   Salicylate Lvl <2.6 2.8 - 30.0 mg/dL    Comment: Performed at [facility], [place] [address] [place], [place] [address]    Blood Alcohol level:  Lab Results  Component Value Date   ETH <10 07/11/2018   ETH 248 (H) 09/40/7680    Metabolic Disorder Labs: No results found for: HGBA1C, MPG No results found for: PROLACTIN No results found for: CHOL, TRIG, HDL, CHOLHDL, VLDL, LDLCALC  Physical Findings: AIMS: Facial and Oral Movements Muscles of Facial Expression: None, normal Lips and Perioral Area: None, normal Jaw: None, normal Tongue: None,  normal,Extremity Movements Upper (arms, wrists, hands, fingers): None, normal Lower (legs, knees, ankles, toes): None, normal, Trunk Movements Neck, shoulders, hips: None, normal, Overall Severity Severity of abnormal movements (highest score from questions above): None, normal Incapacitation due to abnormal movements: None, normal Patient's awareness of abnormal movements (rate only patient's report): No Awareness, Dental Status Current problems with teeth and/or dentures?: No Does patient usually wear dentures?: No  CIWA:  CIWA-Ar Total: 0 COWS:  COWS Total Score: 6  Musculoskeletal: Strength & Muscle Tone: within normal limits Gait & Station: normal Patient leans: N/A  Psychiatric Specialty Exam: Physical Exam   ROS  Blood pressure 126/69, pulse 97, temperature 97.6 F (36.4 C), temperature source Oral, resp. rate 20, height _0  (1.676 m), weight 117.9 kg.Body mass index is 41.97 kg/m.  General Appearance: Casual  Eye Contact:  Poor  Speech:  Garbled  Volume:  Decreased  Mood:  Dysphoric  Affect:  Congruent  Thought Process:  Coherent  Orientation:  Full (Time, Place, and Person)  Thought Content:  Logical  Suicidal Thoughts:  No  Homicidal Thoughts:  No  Memory:  Immediate;   Fair  Judgement:  Fair  Insight:  Fair  Psychomotor Activity:  Normal  Concentration:  Concentration: Good  Recall:  [place] of Knowledge:  Fair  Language:  Fair  Akathisia:  NA  Handed:  Right  AIMS (if indicated):     Assets:  Communication Skills  ADL's:  Intact  Cognition:  WNL  Sleep:  Number of Hours: 6.5   No change in medications today probable discharge tomorrow  Treatment Plan Summary: Daily contact with patient to assess and evaluate symptoms and progress in treatment and Medication management  Boone Gear, MD 07/13/2018, 8:08 AM

## 2018-07-13 NOTE — BHH Group Notes (Signed)
[facility] Group Notes:  (Nursing/MHT/Case Management/Adjunct)  Date:  07/13/2018  Time:  4:45 PM  Type of Therapy:  Nurse Education  Participation Level:  Active  Participation Quality:  Intrusive, Inattentive, Monopolizing and Resistant  Affect:  Defensive, Irritable and Labile  Cognitive:  Disorganized  Insight:  Lacking  Engagement in Group:  Monopolizing and Off Topic  Modes of Intervention:  Discussion and Education  Summary of Progress/Problems: This group focused on Jillyn HiddenGary Chapman's 5 love languages. We discussed definitions of each of the different love languages. We also covered healthy boundaries in our relationships. The group was given homework to take a quiz and find out what their love language is and apply that to their relationships.  [name] 07/13/2018, 4:45 PM

## 2018-07-13 NOTE — Plan of Care (Signed)
D: Patient presents bizarre, disorganized. He took over group and tried to make sense of what was on the board that the RN was teaching. He required frequent redirection. He slept fair last, but did not request medication to help. His appetite is fair, energy low and concentration poor. He rates his depression 3/10, hopelessness 5/10. He complains of tremors, chills, irritability, and knee and shoulder pain. Patient denied these on assessment. Patient denies SI/HI/AVH.  A: Patient checked q15 min, and checks reviewed. Reviewed medication changes with patient and educated on side effects. Educated patient on importance of attending group therapy sessions and educated on several coping skills. Encouarged participation in milieu through recreation therapy and attending meals with peers. Support and encouragement provided. Fluids offered. R: Patient receptive to education on medications, and is medication compliant. Patient contracts for safety on the unit. Goal: to work on "my mood" and "anything."

## 2018-07-13 NOTE — Progress Notes (Signed)
D.  Pt initially agitated on approach, calmed down quickly.  Pt requested medication for anxiety, then went to group immediately after.  Pt has reported auditory hallucinations at times today, states somewhat better.  Pt denies SI/HI at this time.  Pt was observed in dayroom appropriately engaged with peers.  A.  Support and encouragement offered, medication given as ordered  R.  Pt remains safe on the unit, will continue to monitor.

## 2018-07-13 NOTE — Progress Notes (Signed)
Patient did attend the evening speaker AA meeting.  

## 2018-07-13 NOTE — B[facility] Group Notes (Signed)
[facility] LCSW Group Therapy Note  07/13/2018  9:00-10:00AM  Type of Therapy and Topic:  Group Therapy:  Adding Supports Including Being Your Own Support  Participation Level:  Minimal   Description of Group:  Patients in this group were introduced to the concept that additional supports including self-support are an essential part of recovery.  A song entitled "I Need Help!" was played and a group discussion was held in reaction to the idea of needing to add supports.  A song entitled "My Own Hero" was played and a group discussion ensued in which patients stated they could relate to the song and it inspired them to realize they have be willing to help themselves in order to succeed, because other people cannot achieve sobriety or stability for them.  We discussed adding a variety of healthy supports to address the various needs in their lives. Therapeutic Goals: 1)  demonstrate the importance of being a part of one's own support system 2)  discuss reasons people in one's life may eventually be unable to be continually supportive  3)  identify the patient's current support system and   4)  elicit commitments to add healthy supports and to become more conscious of being self-supportive   Summary of Patient Progress:  The patient did not arrive until the last 15 minutes of group and did contribute a few comments.  He did not hear the first song, but did hear the second and said he agreed that he needs more help and needs to accept help.  He appeared more depressed today, was less energetic and outgoing during group.   Therapeutic Modalities:   Motivational Interviewing Activity  Lynnell ChadMareida J Grossman-Orr

## 2018-07-14 MED ORDER — GABAPENTIN 300 MG PO CAPS
300.0000 mg | ORAL_CAPSULE | Freq: Three times a day (TID) | ORAL | Status: DC
  Administered 2018-07-14 – 2018-07-22 (×23): 300 mg via ORAL
  Filled 2018-07-14 (×30): qty 1

## 2018-07-14 MED ORDER — QUETIAPINE FUMARATE 200 MG PO TABS
200.0000 mg | ORAL_TABLET | Freq: Every day | ORAL | Status: DC
  Administered 2018-07-14: 200 mg via ORAL
  Filled 2018-07-14 (×4): qty 1

## 2018-07-14 MED ORDER — BUPROPION HCL ER (XL) 150 MG PO TB24
150.0000 mg | ORAL_TABLET | Freq: Every day | ORAL | Status: DC
  Administered 2018-07-14 – 2018-07-22 (×9): 150 mg via ORAL
  Filled 2018-07-14 (×14): qty 1

## 2018-07-14 NOTE — Progress Notes (Signed)
D: Patient observed up and active on unit. Restless at times with attention seeking, intrusive, and flirtatious behaviors. Overall however patient cooperative and pleasant. Some anxiety noted. Patient's affect animated.   Denies pain, physical complaints.   A: Medicated per orders, no prns requested or needed. Medication education provided. Level III obs in place for safety. Emotional support offered. Patient encouraged to complete Suicide Safety Plan before discharge. Encouraged to attend and participate in unit programming.   R: Patient verbalizes understanding of POC. Patient denies SI/HI/VH however endorses some mild AH. Patient remains safe on level III obs. Will continue to monitor throughout the night.

## 2018-07-14 NOTE — Plan of Care (Signed)
  Problem: Education: Goal: Verbalization of understanding the information provided will improve Outcome: Progressing   Problem: Activity: Goal: Interest or engagement in activities will improve Outcome: Progressing    D: Pt alert and oriented on the unit. Pt engaging with RN staff and other pts. Pt denies SI/HI, AH, but endorses AH. Pt also participated during unit groups and activities. Pt's goal for today is "to stop the voices." Pt is pleasant and cooperative. A: Education, support and encouragement provided, q15 minute safety checks remain in effect. Medications administered per MD orders. R: No reactions/side effects to medicine noted. Pt denies any concerns at this time, and verbally contracts for safety. Pt ambulating on the unit with no issues. Pt remains safe on and off the unit.

## 2018-07-14 NOTE — Progress Notes (Signed)
Chaplain consulting at pt request.    Micheal Kirk related frustration, fear, and confusion around his path forward.  He is [name] with a providential view of God's interaction with history and wonders why God is not leading him to good places in his life.  Wonders why / how I go on if I am to continue suffering.       Micheal Kirk related description of events similar to those with other clinicians.  Recalled he had a period of sobriety (9 months?).  Had relationship with girlfriend which he valued, but girlfriend would not stop drinking.  Related financial stress in trying to pay for her debt.  He [facility] and woke up in jail unable to recall events that preceded.   [name] felt that he was finally getting to a place where good things were happening in his life and feels this has been stripped away.  [name] states that he longs for peace and for there to be moments when his life is not a struggle.    Chaplain provided space for [name] to voice frustration and fear.    Related [name]'s feeling of absence of God to narrative of [name] wondering in wilderness - inviting context in [name]'s faith tradition.  Noted moments of God's provision and care and oriented toward [name]'s understanding of God's presence in his journey.  [name] had shared in group that he often keeps a smile and doesn't let others know what he is experiencing.  When he shared his current frustrations in group, others responded to him with understanding.  Related this to God's presence and provision during this step of [facility].   [name] requested prayers.   Chaplain will follow for continued spiritual support and assessment.      Burnis KingfisherMatthew Cristina Ceniceros, MDiv, [facility]
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
## 2018-07-14 NOTE — Progress Notes (Signed)
Recreation Therapy Notes  Date: 11.18.19 Time: 0930 Location: [address] Dayroom  Group Topic: Stress Management  Goal Area(s) Addresses:  Patient will verbalize importance of using healthy stress management.  Patient will identify positive emotions associated with healthy stress management.   Behavioral Response: Engaged  Intervention: Stress Management  Activity :  Guided Imagery.  LRT introduced the stress management technique of guided imagery.  LRT read a script that took patients on a journey through the forest.  Patients were to listen as the script was read to engage in the activity.  Education:  Stress Management, Discharge Planning.   Education Outcome: Acknowledges edcuation/In group clarification offered/Needs additional education  Clinical Observations/Feedback: Pt attended and participated in group.     Amani Nodarse,  LRT/CTRS         Romie Tay A 07/14/2018 11:20 AM 

## 2018-07-14 NOTE — Tx Team (Signed)
Interdisciplinary Treatment and Diagnostic Plan Update  07/14/2018 Time of Session:  Tylique Aull MRN: [id number]  Principal Diagnosis: Bipolar disorder, unspecified ([place])  Secondary Diagnoses: Principal Problem:   Bipolar disorder, unspecified ([place])   Current Medications:  Current Facility-Administered Medications  Medication Dose Route Frequency Provider Last Rate Last Dose  . albuterol (PROVENTIL HFA;VENTOLIN HFA) 108 (90 Base) MCG/ACT inhaler 2 puff  2 puff Inhalation Q4H PRN Cobos, Myer Peer, MD   2 puff at 07/14/18 1103  . alum & mag hydroxide-simeth (MAALOX/MYLANTA) 200-200-20 MG/5ML suspension 30 mL  30 mL Oral Q4H PRN Laverle Hobby, PA-C      . buPROPion [facility] SR) 12 hr tablet 100 mg  100 mg Oral Daily Cobos, Myer Peer, MD   100 mg at 07/14/18 0805  . gabapentin (NEURONTIN) capsule 100 mg  100 mg Oral TID Cobos, Myer Peer, MD   100 mg at 07/14/18 1202  . hydrOXYzine (ATARAX/VISTARIL) tablet 50 mg  50 mg Oral Q6H PRN Money, Lowry Ram, FNP   50 mg at 07/13/18 2153  . ibuprofen (ADVIL,MOTRIN) tablet 800 mg  800 mg Oral Q8H PRN Money, Lowry Ram, FNP   800 mg at 07/12/18 1033  . loperamide (IMODIUM) capsule 2-4 mg  2-4 mg Oral PRN Laverle Hobby, PA-C      . LORazepam (ATIVAN) tablet 1 mg  1 mg Oral Q6H PRN Laverle Hobby, PA-C   1 mg at 07/14/18 1011  . magnesium hydroxide (MILK OF MAGNESIA) suspension 30 mL  30 mL Oral Daily PRN Laverle Hobby, PA-C      . multivitamin with minerals tablet 1 tablet  1 tablet Oral Daily Laverle Hobby, PA-C   1 tablet at 07/14/18 0805  . nicotine (NICODERM CQ - dosed in mg/24 hours) patch 21 mg  21 mg Transdermal Daily Cobos, Myer Peer, MD   21 mg at 07/14/18 0806  . OLANZapine zydis (ZYPREXA) disintegrating tablet 5 mg  5 mg Oral Q8H PRN Cobos, Myer Peer, MD   5 mg at 07/12/18 1453   And  . ziprasidone (GEODON) injection 20 mg  20 mg Intramuscular PRN Cobos, Myer Peer, MD      . ondansetron (ZOFRAN-ODT) disintegrating  tablet 4 mg  4 mg Oral Q6H PRN Laverle Hobby, PA-C      . QUEtiapine (SEROQUEL) tablet 100 mg  100 mg Oral QHS Money, Travis B, FNP   100 mg at 07/13/18 2154  . QUEtiapine (SEROQUEL) tablet 25 mg  25 mg Oral BID Cobos, Myer Peer, MD   25 mg at 07/14/18 0806  . thiamine (B-1) injection 100 mg  100 mg Intramuscular Once Patriciaann Clan E, PA-C      . thiamine (VITAMIN B-1) tablet 100 mg  100 mg Oral Daily Patriciaann Clan E, PA-C   100 mg at 07/14/18 0806  . traZODone (DESYREL) tablet 50 mg  50 mg Oral QHS PRN Laverle Hobby, PA-C       PTA Medications: Medications Prior to Admission  Medication Sig Dispense Refill Last Dose  . albuterol (PROVENTIL HFA;VENTOLIN HFA) 108 (90 Base) MCG/ACT inhaler Inhale 2 puffs into the lungs every 4 (four) hours as needed for wheezing or shortness of breath. (Patient not taking: Reported on 06/10/2018) 1 Inhaler 1 Not Taking at Unknown time    Patient Stressors: Financial difficulties Legal issue Loss of girlfriend (break up) Occupational concerns Substance abuse  Patient Strengths: Ability for insight Curator fund of knowledge Physical Health  Treatment Modalities: Medication Management, Group therapy, Case management,  1 to 1 session with clinician, Psychoeducation, Recreational therapy.   Physician Treatment Plan for Primary Diagnosis: Bipolar disorder, unspecified ([place]) Long Term Goal(s): Improvement in symptoms so as ready for discharge Improvement in symptoms so as ready for discharge   Short Term Goals: Ability to identify and develop effective coping behaviors will improve Ability to maintain clinical measurements within normal limits will improve Compliance with prescribed medications will improve Ability to identify triggers associated with substance abuse/mental health issues will improve Ability to identify changes in lifestyle to reduce recurrence of condition will improve Ability to verbalize feelings will  improve Ability to disclose and discuss suicidal ideas Ability to demonstrate self-control will improve  Medication Management: Evaluate patient's response, side effects, and tolerance of medication regimen.  Therapeutic Interventions: 1 to 1 sessions, Unit Group sessions and Medication administration.  Evaluation of Outcomes: Not Met  Physician Treatment Plan for Secondary Diagnosis: Principal Problem:   Bipolar disorder, unspecified ([place])  Long Term Goal(s): Improvement in symptoms so as ready for discharge Improvement in symptoms so as ready for discharge   Short Term Goals: Ability to identify and develop effective coping behaviors will improve Ability to maintain clinical measurements within normal limits will improve Compliance with prescribed medications will improve Ability to identify triggers associated with substance abuse/mental health issues will improve Ability to identify changes in lifestyle to reduce recurrence of condition will improve Ability to verbalize feelings will improve Ability to disclose and discuss suicidal ideas Ability to demonstrate self-control will improve     Medication Management: Evaluate patient's response, side effects, and tolerance of medication regimen.  Therapeutic Interventions: 1 to 1 sessions, Unit Group sessions and Medication administration.  Evaluation of Outcomes: Not Met   RN Treatment Plan for Primary Diagnosis: Bipolar disorder, unspecified ([place]) Long Term Goal(s): Knowledge of disease and therapeutic regimen to maintain health will improve  Short Term Goals: Ability to participate in decision making will improve, Ability to verbalize feelings will improve, Ability to disclose and discuss suicidal ideas, Ability to identify and develop effective coping behaviors will improve and Compliance with prescribed medications will improve  Medication Management: RN will administer medications as ordered by provider, will assess and  evaluate patient's response and provide education to patient for prescribed medication. RN will report any adverse and/or side effects to prescribing provider.  Therapeutic Interventions: 1 on 1 counseling sessions, Psychoeducation, Medication administration, Evaluate responses to treatment, Monitor vital signs and CBGs as ordered, Perform/monitor CIWA, COWS, AIMS and Fall Risk screenings as ordered, Perform wound care treatments as ordered.  Evaluation of Outcomes: Not Met   LCSW Treatment Plan for Primary Diagnosis: Bipolar disorder, unspecified ([place]) Long Term Goal(s): Safe transition to appropriate next level of care at discharge, Engage patient in therapeutic group addressing interpersonal concerns.  Short Term Goals: Engage patient in aftercare planning with referrals and resources  Therapeutic Interventions: Assess for all discharge needs, 1 to 1 time with Social worker, Explore available resources and support systems, Assess for adequacy in community support network, Educate family and significant other(s) on suicide prevention, Complete Psychosocial Assessment, Interpersonal group therapy.  Evaluation of Outcomes: Not Met   Progress in Treatment: Attending groups: Yes. Participating in groups: Yes. Taking medication as prescribed: Yes. Toleration medication: Yes. Family/Significant other contact made: No, will contact:  the patient's probation officer  Patient understands diagnosis: Yes. Discussing patient identified problems/goals with staff: Yes. Medical problems stabilized or resolved: Yes. Denies suicidal/homicidal ideation: Yes. Issues/concerns  per patient self-inventory: No. Other:   New problem(s) identified: None   New Short Term/Long Term Goal(s):Detox, medication stabilization, elimination of SI thoughts, development of comprehensive mental wellness plan.   Patient Goals:  I want to learn how not to get mad and how to handle my emotions  Discharge Plan or  Barriers: Patient is interested in [facility] or [facility] for residential treatment. CSW will assess for appropriate referrals and possible discharge planning.   Reason for Continuation of Hospitalization: Anxiety Depression Hallucinations Medication stabilization Suicidal ideation  Estimated Length of Stay: 3-5 days   Attendees: Patient: 07/14/2018 1:05 PM  Physician: Dr. Maris Berger, MD  07/14/2018 1:05 PM  Nursing: Elberta Fortis.A, RN  07/14/2018 1:05 PM  RN Care Manager: Lars Pinks, RN 07/14/2018 1:05 PM  Social Worker: Radonna Ricker, [place] 07/14/2018 1:05 PM  Recreational Therapist:  07/14/2018 1:05 PM  Other:  07/14/2018 1:05 PM  Other:  07/14/2018 1:05 PM  Other: 07/14/2018 1:05 PM    Scribe for Treatment Team: Marylee Floras, [place] 07/14/2018 1:05 PM

## 2018-07-14 NOTE — B[facility] Group Notes (Addendum)
Pt attended spiritual care group on grief and loss facilitated by chaplain Jerene Pitch   Group goal of establishing open and affirming space for members to recognize process of grief, normalize and support grief experience and provide psycho social education and grief support. Group opened with brief discussion and psycho-social ed around grief and loss in relationships and in relation to self - identifying life patterns, circumstances, changes connected to grief response. Group participated in facilitated process group around topic of grief.Group members engaged with Four Tasks of Grief as a framework for understanding grief journey.    Micheal Kirk was present throughout group.  Was periodically tearful and with raised voice.  Expressed frustration in his current situation and related grief in his feeling of lack of stability in life.  Related he is having difficulty finding his path forward.   Micheal Kirk was met with understanding from other group members.  Micheal Kirk expressed surprise at this and found this helpful.  Stated he generally keeps "a smile" and "makes jokes."  Other group members noticed this and offered affirmation for his engagement in group.    Micheal Kirk requested to speak with chaplain individually following recreation.   Jerene Pitch, MDiv, [facility]

## 2018-07-15 LAB — HEMOGLOBIN A1C
Hgb A1c MFr Bld: 5.6 % (ref 4.8–5.6)
Mean Plasma Glucose: 114 mg/dL

## 2018-07-15 MED ORDER — QUETIAPINE FUMARATE 300 MG PO TABS
300.0000 mg | ORAL_TABLET | Freq: Every day | ORAL | Status: DC
  Administered 2018-07-15 – 2018-07-21 (×7): 300 mg via ORAL
  Filled 2018-07-15 (×10): qty 1

## 2018-07-15 NOTE — Plan of Care (Signed)
  Problem: Activity: Goal: Interest or engagement in activities will improve Outcome: Progressing   Problem: Coping: Goal: Ability to verbalize frustrations and anger appropriately will improve Outcome: Progressing   D: Pt alert and oriented on the unit. Pt engaging with RN staff and other pts. Pt denies SI/HI, AH. Pt endorses AH, "just a little bit, but not as much." Pt stated that his goal for today is "to stop the voices and help get long term treatment." Pt is pleasant and cooperative. A: Education, support and encouragement provided, q15 minute safety checks remain in effect. Medications administered per MD orders. R: No reactions/side effects to medicine noted. Pt denies any concerns at this time, and verbally contracts for safety. Pt ambulating on the unit with no issues. Pt remains safe on and off the unit.

## 2018-07-15 NOTE — B[facility] Group Notes (Signed)
B[facility] Group Notes:  (Nursing/MHT/Case Management/Adjunct)  Date:  07/15/2018  Time:  4:00 pm  Type of Therapy:  Psychoeducational Skills  Participation Level:  Active  Participation Quality:  Appropriate  Affect:  Appropriate  Cognitive:  Appropriate  Insight:  Appropriate  Engagement in Group:  Engaged  Modes of Intervention:  Discussion and Education  Summary of Progress/Problems:  Patient was alert and active during group.   Earline MayotteKnight, Chinedum Vanhouten Shephard 07/15/2018, 5:34 PM
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
## 2018-07-16 MED ORDER — TRIAMCINOLONE ACETONIDE 0.1 % EX CREA
TOPICAL_CREAM | Freq: Two times a day (BID) | CUTANEOUS | Status: DC
  Administered 2018-07-16 – 2018-07-22 (×7): via TOPICAL
  Filled 2018-07-16: qty 454

## 2018-07-16 MED ORDER — NICOTINE POLACRILEX 2 MG MT GUM
2.0000 mg | CHEWING_GUM | OROMUCOSAL | Status: DC | PRN
  Filled 2018-07-16: qty 1

## 2018-07-16 MED ORDER — HYDROXYZINE HCL 50 MG PO TABS
50.0000 mg | ORAL_TABLET | Freq: Four times a day (QID) | ORAL | Status: DC | PRN
  Administered 2018-07-16 – 2018-07-22 (×16): 50 mg via ORAL
  Filled 2018-07-16 (×9): qty 1
  Filled 2018-07-16: qty 10
  Filled 2018-07-16 (×6): qty 1

## 2018-07-16 NOTE — Therapy (Signed)
Occupational Therapy Group Note  Date:  07/16/2018 Time:  11:28 AM  Group Topic/Focus:  Stress Management  Participation Level:  Active  Participation Quality:  Drowsy and Monopolizing  Affect:  Blunted  Cognitive:  Appropriate  Insight: Lacking  Engagement in Group:  Engaged and Off Topic  Modes of Intervention:  Activity, Discussion, Education and Socialization  Additional Comments:    S: "I need to work on my impulses"  O: Education given on stress management in reference to negative vs positive coping mechanisms. Various styles of coping strategies explored to help pt choose best to personally meet needs. Discussion facilitated and encouraged amongst group for sharing of personal experiences.  A: Pt presents to group with blunted affect, monopolizing but appropriate with redirection. 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 Further education given on how to use music to improve mood.  P: OT will continue to follow up while pt acute.   Dalphine HandingKaylee Rakeya Glab, MSOT, OTR/L Behavioral Health OT/ Acute Relief OT PHP Office: [phone]  Dalphine HandingKaylee Deitra Craine 07/16/2018, 11:28 AM

## 2018-07-16 NOTE — Progress Notes (Signed)
Patient self inventory- Patient slept well last night and did not request sleep medications. Appetite is good, energy level normal, concentration poor. Depression, hopelessness, and anxiety rated 5, 5, 3 out of 10. Endorses agitation and irritability. Denies SI HI AVH. Denies physical problems. Endorses physical pain 7/10 in his knee and shoulder, as well as a headache 8/10. Medication given. Patient's goal is "to get my pills right and set up treatment."  Patient is compliant with medications prescribed per provider. No side effects noted. Safety is maintained with 15 minute checks as well as environmental checks. Will continue to monitor and provide support.

## 2018-07-16 NOTE — [facility] Group Notes (Signed)
[facility] Group Notes:  (Nursing/MHT/Case Management/Adjunct)  Date:  07/16/2018  Time:  7:50 PM  Type of Therapy:  Nurse Education  Participation Level:  Active  Participation Quality:  Monopolizing and Sharing  Affect:  Irritable and Not Congruent  Cognitive:  Confused and Delusional  Insight:  Lacking  Engagement in Group:  Limited and Monopolizing  Modes of Intervention:  Discussion and Education  Summary of Progress/Problems: RN led group on "Needs Assessment." We discussed positive self affirmation, the triangle of "emotions," "actions," and "thoughts", and Maslow's hierarchy of needs. We discussed the importance of meeting needs in a healthy manner.   Kirstie MirzaJonathan C Darryn Kydd 07/16/2018, 7:50 PM

## 2018-07-16 NOTE — Progress Notes (Signed)
Recreation Therapy Notes  Date: 11.20.19 Time: 0930 Location: [address] Dayroom  Group Topic: Stress Management  Goal Area(s) Addresses:  Patient will verbalize importance of using healthy stress management.  Patient will identify positive emotions associated with healthy stress management.   Behavioral Response: Engaged  Intervention: Stress Management  Activity :  Guided Imagery.  LRT introduced the stress management technique of guided imagery.  LRT read Kirk script to guide patients through Kirk peaceful meadow.  Patients were to follow along as script was read to engage in activity.  Education:  Stress Management, Discharge Planning.   Education Outcome: Acknowledges edcuation/In group clarification offered/Needs additional education  Clinical Observations/Feedback: Pt attended and participated in activity.    Caroll RancherMarjette Santosh Kirk, LRT/CTRS         Caroll RancherLindsay, Micheal Kirk 07/16/2018 11:17 AM

## 2018-07-16 NOTE — Progress Notes (Signed)
Patient did attend the evening speaker NA meeting.  
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
## 2018-07-16 NOTE — Plan of Care (Signed)
Patient feels as if he is progressing towards his goals.  Problem: Education: Goal: Emotional status will improve Outcome: Progressing Goal: Mental status will improve Outcome: Progressing Goal: Verbalization of understanding the information provided will improve Outcome: Progressing   Problem: Activity: Goal: Interest or engagement in activities will improve Outcome: Progressing
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
## 2018-07-17 NOTE — [facility] Suicide Risk Assessment (Addendum)
[facility] INPATIENT:  Family/Significant Other Suicide Prevention Education  Suicide Prevention Education:  Contact Attempts: Glendon Axeam Trang, his probation officer [phone] has been identified by the patient as the family member/significant other with whom the patient will be residing, and identified as the person(s) who will aid the patient in the event of a mental health crisis.  With written consent from the patient, two attempts were made to provide suicide prevention education, prior to and/or following the patient's discharge.  We were unsuccessful in providing suicide prevention education.  A suicide education pamphlet was given to the patient to share with family/significant other.  Date and time of first attempt:07/17/18 2:15 PM   Cherie Bohaboy 07/17/2018, 2:19 PM   Date and time of second attempt: 07/22/18 at 10:44AM--Left message regarding pt's discharge.   Deseray Daponte S. Alan RipperHolloway, MSW, LCSW Clinical Social Worker 07/22/2018 10:44 AM
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
## 2018-07-17 NOTE — Progress Notes (Signed)
Adult Psychoeducational Group Note  Date:  07/17/2018 Time:  9:51 PM  Group Topic/Focus:  Wrap-Up Group:   The focus of this group is to help patients review their daily goal of treatment and discuss progress on daily workbooks.  Participation Level:  Active  Participation Quality:  Appropriate  Affect:  Appropriate  Cognitive:  Alert  Insight: Appropriate  Engagement in Group:  Engaged  Modes of Intervention:  Discussion  Additional Comments:  Pt stated that today has been "eventful". Pt did state that he has has some benefits from being here including: medication adjustment, better outlook on life, and enjoying interacting with other pt. In regards to the quote: "You may have to fight a battle more than once", pt stated that "Any problem I have, I face it head on, God has my back".   Kaleen OdeaCOOKE, Chung Chagoya R 07/17/2018, 9:51 PM

## 2018-07-17 NOTE — Plan of Care (Addendum)
Depression, hopelessness, and anxiety rated 0, 0, 6. Patient's goal is getting meds straight getting a plan for treatment center. Denies SI HI AVH.   Problem: Education: Goal: Mental status will improve Outcome: Progressing Goal: Verbalization of understanding the information provided will improve Outcome: Progressing   Problem: Activity: Goal: Interest or engagement in activities will improve Outcome: Progressing Goal: Sleeping patterns will improve Outcome: Progressing

## 2018-07-18 NOTE — Progress Notes (Signed)
Pt attended AA group this evening.  

## 2018-07-18 NOTE — Progress Notes (Signed)
Pt has been observed in the dayroom all evening sitting with other patients talking and playing cards.  He was irritable at the beginning of the shift, but would not give any specifics as to why.  He requested his prn anxiety medication too early and that seemed to be the trigger for his irritability when told he would have to wait.  Pt has been loud and attention seeking this evening.  He denies SI/HI/AVH.  Support and encouragement offered.  Meds given as ordered.  PRN Vistaril was given for anxiety at the appropriate time.  Discharge plans are in process.  Safety maintained with q15 minute checks.

## 2018-07-18 NOTE — Progress Notes (Signed)
Pt reports his day was ok.  [facility].  [facility]  He denies SI/HI/AVH.  He denies any withdrawal symptoms at this time.  He has been in the dayroom watching TV and talking with other patients.  He makes his needs known to staff.  He has not been as irritable this evening as last evening.  Support and encouragement offered. Discharge plans are in process.  Safety maintained with q15 minute checks.
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
## 2018-07-18 NOTE — Tx Team (Signed)
Interdisciplinary Treatment and Diagnostic Plan Update  07/18/2018 Time of Session:  Micheal Kirk MRN: [id number]  Principal Diagnosis: Bipolar disorder, unspecified (HCC)  Secondary Diagnoses: Principal Problem:   Bipolar disorder, unspecified (HCC)   Current Medications:  Current Facility-Administered Medications  Medication Dose Route Frequency Provider Last Rate Last Dose  . albuterol (PROVENTIL HFA;VENTOLIN HFA) 108 (90 Base) MCG/ACT inhaler 2 puff  2 puff Inhalation Q4H PRN Cobos, Rockey Situ, MD   2 puff at 07/17/18 2304  . alum & mag hydroxide-simeth (MAALOX/MYLANTA) 200-200-20 MG/5ML suspension 30 mL  30 mL Oral Q4H PRN Donell Sievert E, PA-C      . buPROPion (WELLBUTRIN XL) 24 hr tablet 150 mg  150 mg Oral Daily Micheal Likens, MD   150 mg at 07/18/18 0810  . gabapentin (NEURONTIN) capsule 300 mg  300 mg Oral TID Micheal Likens, MD   300 mg at 07/18/18 1201  . hydrOXYzine (ATARAX/VISTARIL) tablet 50 mg  50 mg Oral Q6H PRN Armandina Stammer I, NP   50 mg at 07/18/18 1002  . ibuprofen (ADVIL,MOTRIN) tablet 800 mg  800 mg Oral Q8H PRN Money, Feliz Beam B, FNP   800 mg at 07/18/18 1002  . magnesium hydroxide (MILK OF MAGNESIA) suspension 30 mL  30 mL Oral Daily PRN Kerry Hough, PA-C      . multivitamin with minerals tablet 1 tablet  1 tablet Oral Daily Kerry Hough, PA-C   1 tablet at 07/18/18 4098  . nicotine polacrilex (NICORETTE) gum 2 mg  2 mg Oral PRN Micheal Likens, MD      . OLANZapine zydis (ZYPREXA) disintegrating tablet 5 mg  5 mg Oral Q8H PRN Cobos, Rockey Situ, MD   5 mg at 07/12/18 1453   And  . ziprasidone (GEODON) injection 20 mg  20 mg Intramuscular PRN Cobos, Rockey Situ, MD      . QUEtiapine (SEROQUEL) tablet 25 mg  25 mg Oral BID Cobos, Rockey Situ, MD   25 mg at 07/18/18 1191  . QUEtiapine (SEROQUEL) tablet 300 mg  300 mg Oral QHS Micheal Likens, MD   300 mg at 07/17/18 2103  . thiamine (B-1) injection 100 mg  100 mg  Intramuscular Once Donell Sievert E, PA-C      . thiamine (VITAMIN B-1) tablet 100 mg  100 mg Oral Daily Donell Sievert E, PA-C   100 mg at 07/18/18 4782  . traZODone (DESYREL) tablet 50 mg  50 mg Oral QHS PRN Donell Sievert E, PA-C      . triamcinolone cream (KENALOG) 0.1 %   Topical BID Armandina Stammer I, NP       PTA Medications: Medications Prior to Admission  Medication Sig Dispense Refill Last Dose  . albuterol (PROVENTIL HFA;VENTOLIN HFA) 108 (90 Base) MCG/ACT inhaler Inhale 2 puffs into the lungs every 4 (four) hours as needed for wheezing or shortness of breath. (Patient not taking: Reported on 06/10/2018) 1 Inhaler 1 Not Taking at Unknown time    Patient Stressors: Financial difficulties Legal issue Loss of girlfriend (break up) Occupational concerns Substance abuse  Patient Strengths: Ability for insight Wellsite geologist fund of knowledge Physical Health  Treatment Modalities: Medication Management, Group therapy, Case management,  1 to 1 session with clinician, Psychoeducation, Recreational therapy.   Physician Treatment Plan for Primary Diagnosis: Bipolar disorder, unspecified (HCC) Long Term Goal(s): Improvement in symptoms so as ready for discharge Improvement in symptoms so as ready for discharge   Short Term Goals:  Ability to identify and develop effective coping behaviors will improve Ability to maintain clinical measurements within normal limits will improve Compliance with prescribed medications will improve Ability to identify triggers associated with substance abuse/mental health issues will improve Ability to identify changes in lifestyle to reduce recurrence of condition will improve Ability to verbalize feelings will improve Ability to disclose and discuss suicidal ideas Ability to demonstrate self-control will improve  Medication Management: Evaluate patient's response, side effects, and tolerance of medication regimen.  Therapeutic  Interventions: 1 to 1 sessions, Unit Group sessions and Medication administration.  Evaluation of Outcomes: Progressing  Physician Treatment Plan for Secondary Diagnosis: Principal Problem:   Bipolar disorder, unspecified (HCC)  Long Term Goal(s): Improvement in symptoms so as ready for discharge Improvement in symptoms so as ready for discharge   Short Term Goals: Ability to identify and develop effective coping behaviors will improve Ability to maintain clinical measurements within normal limits will improve Compliance with prescribed medications will improve Ability to identify triggers associated with substance abuse/mental health issues will improve Ability to identify changes in lifestyle to reduce recurrence of condition will improve Ability to verbalize feelings will improve Ability to disclose and discuss suicidal ideas Ability to demonstrate self-control will improve     Medication Management: Evaluate patient's response, side effects, and tolerance of medication regimen.  Therapeutic Interventions: 1 to 1 sessions, Unit Group sessions and Medication administration.  Evaluation of Outcomes: Progressing   RN Treatment Plan for Primary Diagnosis: Bipolar disorder, unspecified (HCC) Long Term Goal(s): Knowledge of disease and therapeutic regimen to maintain health will improve  Short Term Goals: Ability to participate in decision making will improve, Ability to verbalize feelings will improve, Ability to disclose and discuss suicidal ideas, Ability to identify and develop effective coping behaviors will improve and Compliance with prescribed medications will improve  Medication Management: RN will administer medications as ordered by provider, will assess and evaluate patient's response and provide education to patient for prescribed medication. RN will report any adverse and/or side effects to prescribing provider.  Therapeutic Interventions: 1 on 1 counseling sessions,  Psychoeducation, Medication administration, Evaluate responses to treatment, Monitor vital signs and CBGs as ordered, Perform/monitor CIWA, COWS, AIMS and Fall Risk screenings as ordered, Perform wound care treatments as ordered.  Evaluation of Outcomes: Progressing   LCSW Treatment Plan for Primary Diagnosis: Bipolar disorder, unspecified (HCC) Long Term Goal(s): Safe transition to appropriate next level of care at discharge, Engage patient in therapeutic group addressing interpersonal concerns.  Short Term Goals: Engage patient in aftercare planning with referrals and resources  Therapeutic Interventions: Assess for all discharge needs, 1 to 1 time with Social worker, Explore available resources and support systems, Assess for adequacy in community support network, Educate family and significant other(s) on suicide prevention, Complete Psychosocial Assessment, Interpersonal group therapy.  Evaluation of Outcomes: Progressing   Progress in Treatment: Attending groups: Yes. Participating in groups: Yes. Taking medication as prescribed: Yes. Toleration medication: Yes. Family/Significant other contact made: No, will contact:  the patient's probation officer  Patient understands diagnosis: Yes. Discussing patient identified problems/goals with staff: Yes. Medical problems stabilized or resolved: Yes. Denies suicidal/homicidal ideation: Yes. Issues/concerns per patient self-inventory: No. Other:   New problem(s) identified: None   New Short Term/Long Term Goal(s):Detox, medication stabilization, elimination of SI thoughts, development of comprehensive mental wellness plan.   Patient Goals:  I want to learn how not to get mad and how to handle my emotions  Discharge Plan or Barriers: Patient is interested  in [facility] or [facility] for residential treatment. CSW has referred the patient to [facility] and [facility] for possible residential substance abuse treatment and [facility] for outpatient  medication management and therapy services.   Reason for Continuation of Hospitalization: Anxiety Depression Hallucinations Medication stabilization Suicidal ideation  Estimated Length of Stay: 3-5 days   Attendees: Patient: 07/18/2018 1:49 PM  Physician: Dr. Jolyne Loa, MD  07/18/2018 1:49 PM  Nursing: Ethelene Browns.A, RN; Erskine Squibb.Val Eagle RN  07/18/2018 1:49 PM  RN Care Manager: Onnie Boer, RN 07/18/2018 1:49 PM  Social Worker: Baldo Daub, LCSWA 07/18/2018 1:49 PM  Recreational Therapist:  07/18/2018 1:49 PM  Other:  07/18/2018 1:49 PM  Other:  07/18/2018 1:49 PM  Other: 07/18/2018 1:49 PM    Scribe for Treatment Team: Maeola Sarah, LCSWA 07/18/2018 1:49 PM

## 2018-07-18 NOTE — Progress Notes (Signed)
Recreation Therapy Notes  Date: 07/18/18 Time: 0930 Location: [address] Dayroom  Group Topic: Stress Management  Goal Area(s) Addresses:  Patient will verbalize importance of using healthy stress management.  Patient will identify positive emotions associated with healthy stress management.   Behavioral Response: Engaged  Intervention: Stress Management  Activity :  Meditation.  LRT introduced the stress management technique of meditation to the patients.  LRT played a meditation that focused on being resilient in the face of challenge.  Patients were to listen and follow along as the meditation was played to engage in activity.  Education:  Stress Management, Discharge Planning.   Education Outcome: Acknowledges edcuation/In group clarification offered/Needs additional education  Clinical Observations/Feedback: Pt attended and participated in activity.     Caroll RancherMarjette Khameron Gruenwald, LRT/CTRS     Caroll RancherLindsay, Jadarious Dobbins A 07/18/2018 12:31 PM

## 2018-07-18 NOTE — Progress Notes (Signed)
DAR NOTE: Patient presents with calm affect and pleasant mood. Pt has been visible in the milieu interacting well with peers. Pt has been cooperative e with care. Complained knee  Pain, denied  auditory and visual hallucinations.  Rates depression at 0, hopelessness at 0, and anxiety at 0.  Maintained on routine safety checks.  Medications given as prescribed.  Support and encouragement offered as needed.  Attended group and participated.  States goal for today is" get an interview with [facility]."  Patient observed socializing with peers in the dayroom.  Offered no complaint.

## 2018-07-19 NOTE — Progress Notes (Signed)
Pt presents with a flat affect. Pt reports decreased depression and anxiety. Pt rates depression 0 and anxiety 6/10.  Pt reported difficulty sleeping last night and expressed waking up in the middle of the night. Pt denies SI/HI. Pt denies any withdrawal symptoms.  Pt expressed that [facility]. Pt compliant with taking meds and denies any side effects. Pt compliant with attending groups and participating.   Medications administered as ordered per MD. Verbal support provided. Pt encouraged to attend groups. 15 minute checks performed for safety.  Pt compliant with tx plan.

## 2018-07-19 NOTE — Progress Notes (Signed)
Pt noted to be labile this evening. Pt observed several times today talking loud and cursing. Pt redirected by staff for inappropriate tone and language but was not receptive.

## 2018-07-19 NOTE — [facility] Counselor (Signed)
Clinical Social Work Note  Pt states that he called [facility] for a phone interview in case he does not get into [facility].  They have requested that we forward to them the following: Psychiatric Evaluation Detox notes Medication list HGB A1C 3 blood pressure readings  This will be done by CSW first thing tomorrow morning.  Ambrose MantleMareida Grossman-Orr, LCSW 07/19/2018, 5:34 PM
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
## 2018-07-19 NOTE — Plan of Care (Signed)
Pt verbally contracts for safety. Pt safety maintained by 15 minute checks. No injury noted.

## 2018-07-19 NOTE — [facility] Group Notes (Signed)
[facility] LCSW Group Therapy Note  Date/Time: 07/19/18, 1000  Type of Therapy/Topic:  Group Therapy:  Balance in Life  Participation Level:  Did not attend  Description of Group:    This group will address the concept of balance and how it feels and looks when one is unbalanced. Patients will be encouraged to process areas in their lives that are out of balance, and identify reasons for remaining unbalanced. Facilitators will guide patients utilizing problem- solving interventions to address and correct the stressor making their life unbalanced. Understanding and applying boundaries will be explored and addressed for obtaining  and maintaining a balanced life. Patients will be encouraged to explore ways to assertively make their unbalanced needs known to significant others in their lives, using other group members and facilitator for support and feedback.  Therapeutic Goals: 1. Patient will identify two or more emotions or situations they have that consume much of in their lives. 2. Patient will identify signs/triggers that life has become out of balance:  3. Patient will identify two ways to set boundaries in order to achieve balance in their lives:  4. Patient will demonstrate ability to communicate their needs through discussion and/or role plays  Summary of Patient Progress:          Therapeutic Modalities:   Cognitive Behavioral Therapy Solution-Focused Therapy Assertiveness Training  Greg Andoni Busch, LCSW 

## 2018-07-19 NOTE — Progress Notes (Signed)
The patient spent the first half of group in his room and then eventually attended the duration of the session.

## 2018-07-19 NOTE — [facility] Group Notes (Signed)
[facility] Group Notes:  (Nursing/MHT/Case Management/Adjunct)  Date:  07/19/2018  Time: 1300 Type of Therapy:  Nurse Education-Patricia Duke, RN.  Participation Level:  Active  Participation Quality:  Appropriate  Affect:  Appropriate  Cognitive:  Appropriate  Insight:  Appropriate  Engagement in Group:  Engaged  Modes of Intervention:  Discussion and Education   

## 2018-07-20 DIAGNOSIS — F332 Major depressive disorder, recurrent severe without psychotic features: Secondary | ICD-10-CM

## 2018-07-20 NOTE — [facility] Group Notes (Signed)
[facility] Group Notes:  (Nursing/MHT/Case Management/Adjunct)  Date:  07/20/2018  Time:  1:25 PM  Type of Therapy:  Psychoeducational Skills  Participation Level:  Active  Participation Quality:  Appropriate  Affect:  Appropriate  Cognitive:  Appropriate  Insight:  Appropriate  Engagement in Group:  Engaged  Modes of Intervention:  Problem-solving  Summary of Progress/Problems: Pt attended Psychoeducational group with top topic healthy support systems.   Jacquelyne BalintForrest, Darick Fetters Shanta 07/20/2018, 1:25 PM

## 2018-07-20 NOTE — Plan of Care (Signed)
Nurse discussed anxiety, depression, coping skills with patient. 

## 2018-07-20 NOTE — Progress Notes (Signed)
Patient attended AA meeting.

## 2018-07-20 NOTE — [facility] Group Notes (Signed)
LCSW Group Therapy Note  07/20/2018    10:00-11:00am   Type of Therapy and Topic:  Group Therapy: Early Messages Received About Anger  Participation Level:  Active   Description of Group:   In this group, patients shared and discussed the early messages received in their lives about anger through parental or other adult modeling, teaching, repression, punishment, violence, and more.  Participants identified how those childhood lessons influence even now how they usually or often react when angered.  The group discussed that anger is a secondary emotion and what may be the underlying emotional themes that come out through anger outbursts or that are ignored through anger suppression.  Finally, as a group there was a conversation about the workbook's quote that "There is nothing wrong with anger; it is just a sign something needs to change."     Therapeutic Goals: 1. Patients will identify one or more childhood message about anger that they received and how it was taught to them. 2. Patients will discuss how these childhood experiences have influenced and continue to influence their own expression or repression of anger even today. 3. Patients will explore possible primary emotions that tend to fuel their secondary emotion of anger. 4. Patients will learn that anger itself is normal and cannot be eliminated, and that healthier coping skills can assist with resolving conflict rather than worsening situations.  Summary of Patient Progress:  The patient shared that his childhood lessons about anger were "If people get you angry, hurt them, protect yourself."   As a result, he states that he now rages "over everything."  He talked about laughing out loud when he is particularly angry in order to keep himself from lashing out.  He stated that initially he did not want to hurt other people and resisted this idea, but later his mother gave him away and he became angry, then stayed that way.  He was able to  look at his own thoughts and acknowledge that often his paranoia is not based on reality but on incorrect cognitions.  Therapeutic Modalities:   Cognitive Behavioral Therapy Motivation Interviewing  Lynnell ChadMareida J Grossman-Orr  .

## 2018-07-20 NOTE — Progress Notes (Signed)
DAR NOTE: Patient presents with anxious affect and depressed mood. Pt observed in the dayroom at the beginning of the shift. Pt stated he ha a good day, pt feeling guilty about his drinking, stated he lost everything in less than 24 hrs due to alcohol. Pt interested in long term treatment and proceed to being peer support personal. Pt com plained of anxiety and medication given as scheduled. Denied SI/HI, AVH and verbally contracted for safety. Pt also attended the AA group which pt sated was helpful to him.  Pt's safety ensured with 15 minute and environmental checks. Will continue to monitor.

## 2018-07-20 NOTE — [facility] Group Notes (Signed)
Adult Psychoeducational Group Note  Date:  07/20/2018 Time:  9:25 AM  Group Topic/Focus:  Orientation:   The focus of this group is to educate the patient on the purpose and policies of crisis stabilization and provide a format to answer questions about their admission.  The group details unit policies and expectations of patients while admitted.  Participation Level:  Active  Participation Quality:  Appropriate  Affect:  Appropriate  Cognitive:  Alert  Insight: Appropriate  Engagement in Group:  Engaged  Modes of Intervention:  Orientation  Additional Comments:  Pt attended orientation group facilitated by MHT Donnetta SimpersMichael A.  Kartel Wolbert M Opie Fanton 07/20/2018, 9:25 AM

## 2018-07-20 NOTE — Progress Notes (Signed)
D   Pt is pleasant on approach and cooperative   He has been in the dayroom interacting with his peers and watching football   He can be loud at times  A    Verbal support given   Medications administered and effectiveness monitored    Q 15 min checks R   Pt is safe at this time

## 2018-07-20 NOTE — [facility] Counselor (Signed)
Clinical Social Work Note  CSW met with pt to discuss his request to be referred to [facility], and explained that it would be a move in the future to go from short-term treatment such as [facility] to long-term treatment such as [facility], rather than vice versa.  He agreed but stated that he was told yesterday he needed to make a plan for where to go, and started to become anxious.  We discussed the possibility of going to [facility] or [facility] while he is waiting for admission to short-term treatment, and he expressed concern that he has no transportation.  CSW informed him we would help with this issue.  He really wants to go to [facility], and is agitated he has not yet had a phone interview.  CSW explained the process and the fact that beds are limited.  Selmer Dominion, LCSW 07/20/2018, 9:55 AM
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
## 2018-07-20 NOTE — Plan of Care (Signed)
  Problem: Education: Goal: Knowledge of [facility] Education information/materials will improve Outcome: Progressing   Problem: Activity: Goal: Interest or engagement in activities will improve Outcome: Progressing   Problem: Coping: Goal: Ability to verbalize frustrations and anger appropriately will improve Outcome: Progressing   

## 2018-07-20 NOTE — Progress Notes (Signed)
D:  Patient's self inventory sheet, patient sleeps good, no sleep medication.  Good appetite, normal energy level, poor concentration.  Denied depression and hopeless, rated anxiety #6.  Denied withdrawals.  Denied SI.  Denied physical problems.  Physical pain, knee, shoulder, worst pain in past 24 hours is #5.  Pain medicine  Is helpful.  Plans to talk to SW about discharge plans.   A:  Medications administered per MD orders.  Emotional support and encouragement given patient. R:   Patient denied SI and HI, contracts for safety.  Denied A/V hallucinations.  Safety maintained with 15 minute checks. Patient very concerned that he may be discharged and not have treatment center set up for him.

## 2018-07-20 NOTE — Progress Notes (Signed)
Patient stated he is very upset, anxiety, agitated because he cannot return to his parents' home.  Wants to go to long term treatment.  If he is discharged tomorrow and does not have any where to go, he will really be upset, might flip out, I'm not doing no on the streets again.  That will put me in jail.  I'm not trying to go to jail.  Basically, if I am put out, I don't know what will happen.   Encouraged patient to be calm, not to get upset and staff will work something out for him tomorrow.

## 2018-07-21 NOTE — Plan of Care (Signed)
Nurse discussed anxiety, depression, coping skills with patient. 

## 2018-07-21 NOTE — Progress Notes (Signed)
Patient is very worried about getting into [facility] or somewhere safe.  Does not want to be homeless.

## 2018-07-21 NOTE — Progress Notes (Signed)
D:  Patient's self inventory sheet, patient sleeps good, no sleep medication given.  Good appetite, normal energy level, poor concentration.  Denied depression and hopeless, anxiety 6.         Denied withdrawals.  Denied SI.  Denied physical problems.  Physical pain, knee, shoulder, worst pain #6 in past 24 hours.  Pain medicine not helpful.  Goal is check progress with [facility].  Plans to call [facility].  Does have discharge plan.  Worried about not having a safe place to go after discharge. A:  Medications administered per MD orders.  Emotional support and encouragement given patient. R:  Denied SI and HI, contracts for safety.  Denied A/V hallucinations.  Safety maintained with 15 minute checks.

## 2018-07-21 NOTE — Progress Notes (Signed)
A patient came to the NS to alert staff that this patient was in distress and needed help.  Staff found pt slouched down in his chair breathing rapidly.  He stated that it felt like his chest was beating fast.  Pt's vitals were obtained with results as pulse 129 and BP 142/85.  He had just taken a Vistaril 50 mg before this episode.  Pt was instructed to do some slow/deep breaths.  Within five minutes he was feeling better.  He was encouraged to go to bed since he had already taken his Seroquel 300 mg.

## 2018-07-21 NOTE — Progress Notes (Signed)
[facility] MD Progress Note  07/21/2018 11:39 AM Micheal Kirk  MRN:  [id number] Subjective: Patient reports partial improvement compared to admission, denies suicidal ideations today.  Acknowledges medications are helping "some".  Currently future oriented and mainly focused on disposition planning. Does not endorse medication side effects.  Objective: I have discussed case with treatment team and have met with patient. 32 year old male, brought to ED for bizarre, combative behavior.  Blood alcohol level 248 on admission.  Reported depression, suicidal ideations, paranoid ideations. Currently presents improved compared to admission.  Mood is improved, affect is more reactive.  No current active psychotic symptoms and self-referential ideations have decreased.  Denies suicidal ideations today.  Presents future oriented and is mainly focused on disposition planning.  He is wanting to go to [facility].  Expresses concern for relapse and decompensation if not referred to a structured/residential setting and describes homelessness as a stressor. Currently on Wellbutrin XL, Neurontin, Seroquel.  Tolerating medications well thus far. No disruptive behaviors on unit, visible in milieu.  Principal Problem: Bipolar disorder, unspecified ([place]) Diagnosis: Principal Problem:   Bipolar disorder, unspecified ([place])  Total Time spent with patient: 20 minutes  Past Psychiatric History: see H&P  Past Medical History:  Past Medical History:  Diagnosis Date  . Asthma   . Eczema     Past Surgical History:  Procedure Laterality Date  . NASAL SEPTUM SURGERY     Family History: History reviewed. No pertinent family history. Family Psychiatric  History: see H&P Social History:  Social History   Substance and Sexual Activity  Alcohol Use Not Currently     Social History   Substance and Sexual Activity  Drug Use Not Currently    Social History   Socioeconomic History  . Marital status: Single   Spouse name: Not on file  . Number of children: Not on file  . Years of education: Not on file  . Highest education level: Not on file  Occupational History  . Not on file  Social Needs  . Financial resource strain: Not on file  . Food insecurity:    Worry: Not on file    Inability: Not on file  . Transportation needs:    Medical: Not on file    Non-medical: Not on file  Tobacco Use  . Smoking status: Current Every Day Smoker    Packs/day: 0.50  . Smokeless tobacco: Never Used  Substance and Sexual Activity  . Alcohol use: Not Currently  . Drug use: Not Currently  . Sexual activity: Not on file  Lifestyle  . Physical activity:    Days per week: Not on file    Minutes per session: Not on file  . Stress: Not on file  Relationships  . Social connections:    Talks on phone: Not on file    Gets together: Not on file    Attends religious service: Not on file    Active member of club or organization: Not on file    Attends meetings of clubs or organizations: Not on file    Relationship status: Not on file  Other Topics Concern  . Not on file  Social History Narrative  . Not on file   Additional Social History:    Pain Medications: denies Prescriptions: denies Over the Counter: denies History of alcohol / drug use?: Yes Longest period of sobriety (when/how long): 9 months Negative Consequences of Use: Legal, Personal relationships Withdrawal Symptoms: Patient aware of relationship between substance abuse and physical/medical complications(anxious) Name  of Substance 1: alcohol 1 - Age of First Use: 12 1 - Amount (size/oz): varies 1 - Frequency: relapsed on 07/10/18 1 - Duration: on-going 1 - Last Use / Amount: 07/10/18  Sleep: Good  Appetite:  Good  Current Medications: Current Facility-Administered Medications  Medication Dose Route Frequency Provider Last Rate Last Dose  . albuterol (PROVENTIL HFA;VENTOLIN HFA) 108 (90 Base) MCG/ACT inhaler 2 puff  2 puff  Inhalation Q4H PRN Celestial Barnfield, Myer Peer, MD   2 puff at 07/20/18 1650  . alum & mag hydroxide-simeth (MAALOX/MYLANTA) 200-200-20 MG/5ML suspension 30 mL  30 mL Oral Q4H PRN Patriciaann Clan E, PA-C      . buPROPion (WELLBUTRIN XL) 24 hr tablet 150 mg  150 mg Oral Daily Pennelope Bracken, MD   150 mg at 07/21/18 0742  . gabapentin (NEURONTIN) capsule 300 mg  300 mg Oral TID Maris Berger T, MD   300 mg at 07/21/18 0739  . hydrOXYzine (ATARAX/VISTARIL) tablet 50 mg  50 mg Oral Q6H PRN Lindell Spar I, NP   50 mg at 07/21/18 0742  . ibuprofen (ADVIL,MOTRIN) tablet 800 mg  800 mg Oral Q8H PRN Money, Darnelle Maffucci B, FNP   800 mg at 07/18/18 1002  . magnesium hydroxide (MILK OF MAGNESIA) suspension 30 mL  30 mL Oral Daily PRN Laverle Hobby, PA-C      . multivitamin with minerals tablet 1 tablet  1 tablet Oral Daily Laverle Hobby, PA-C   1 tablet at 07/21/18 0740  . nicotine polacrilex (NICORETTE) gum 2 mg  2 mg Oral PRN Pennelope Bracken, MD      . OLANZapine zydis (ZYPREXA) disintegrating tablet 5 mg  5 mg Oral Q8H PRN Kymiah Araiza, Myer Peer, MD   5 mg at 07/20/18 1151   And  . ziprasidone (GEODON) injection 20 mg  20 mg Intramuscular PRN Samiyyah Moffa, Myer Peer, MD      . QUEtiapine (SEROQUEL) tablet 25 mg  25 mg Oral BID Vincen Bejar, Myer Peer, MD   25 mg at 07/21/18 0740  . QUEtiapine (SEROQUEL) tablet 300 mg  300 mg Oral QHS Pennelope Bracken, MD   300 mg at 07/20/18 2057  . thiamine (B-1) injection 100 mg  100 mg Intramuscular Once Patriciaann Clan E, PA-C      . thiamine (VITAMIN B-1) tablet 100 mg  100 mg Oral Daily Patriciaann Clan E, PA-C   100 mg at 07/21/18 0740  . traZODone (DESYREL) tablet 50 mg  50 mg Oral QHS PRN Patriciaann Clan E, PA-C      . triamcinolone cream (KENALOG) 0.1 %   Topical BID Lindell Spar I, NP        Lab Results: No results found for this or any previous visit (from the past 48 hour(s)).  Blood Alcohol level:  Lab Results  Component Value Date   ETH <10  07/11/2018   ETH 248 (H) 41/66/0630    Metabolic Disorder Labs: Lab Results  Component Value Date   HGBA1C 5.6 07/13/2018   MPG 114 07/13/2018   No results found for: PROLACTIN Lab Results  Component Value Date   CHOL 148 07/13/2018   TRIG 71 07/13/2018   HDL 52 07/13/2018   CHOLHDL 2.8 07/13/2018   VLDL 14 07/13/2018   LDLCALC 82 07/13/2018    Physical Findings: AIMS: Facial and Oral Movements Muscles of Facial Expression: None, normal Lips and Perioral Area: None, normal Jaw: None, normal Tongue: None, normal,Extremity Movements Upper (arms, wrists, hands, fingers): None,  normal Lower (legs, knees, ankles, toes): None, normal, Trunk Movements Neck, shoulders, hips: None, normal, Overall Severity Severity of abnormal movements (highest score from questions above): None, normal Incapacitation due to abnormal movements: None, normal Patient's awareness of abnormal movements (rate only patient's report): No Awareness, Dental Status Current problems with teeth and/or dentures?: No Does patient usually wear dentures?: No  CIWA:  CIWA-Ar Total: 1 COWS:  COWS Total Score: 2  Musculoskeletal: Strength & Muscle Tone: within normal limits Gait & Station: normal Patient leans: N/A  Psychiatric Specialty Exam: Physical Exam  Nursing note and vitals reviewed. Constitutional: He is oriented to person, place, and time. He appears well-developed and well-nourished.  Cardiovascular: Normal rate.  Respiratory: Effort normal.  Musculoskeletal: Normal range of motion.  Neurological: He is alert and oriented to person, place, and time.  Skin: Skin is warm.    Review of Systems  Psychiatric/Behavioral: Negative for depression, hallucinations and suicidal ideas. The patient is not nervous/anxious and does not have insomnia.   All other systems reviewed and are negative. No headache, no chest pain, no shortness of breath  Blood pressure (!) 149/74, pulse 83, temperature 97.6 F  (36.4 C), temperature source Oral, resp. rate 16, height _0  (1.676 m), weight 117.9 kg.Body mass index is 41.97 kg/m.  General Appearance: Improved grooming  Eye Contact:  Good  Speech:  Clear and Coherent and Normal Rate  Volume:  Normal  Mood:  Partially improved mood  Affect:  Reactive, vaguely anxious, smiles at times appropriately  Thought Process:  Linear and Descriptions of Associations: Intact  Orientation:  Other:  Fully alert and attentive  Thought Content:  No hallucinations, no self-referential ideations noted or endorsed today, not internally preoccupied  Suicidal Thoughts:  No-denies current suicidal or self-injurious ideations, no homicidal or violent ideations  Homicidal Thoughts:  No  Memory:  Recent and remote grossly intact  Judgement:  Other:  Improving  Insight:  Fair/improving  Psychomotor Activity:  Normal  Concentration:  Concentration: Improving and Attention Span: Improving  Recall:  Good  Fund of Knowledge:  Good  Language:  Good  Akathisia:  No  Handed:    AIMS (if indicated):     Assets:  Resilience Social Support  ADL's:  Intact  Cognition:  WNL  Sleep:  Number of Hours: 6.25   Assessment:  32 year old male, brought to ED for bizarre, combative behavior.  Blood alcohol level 248 on admission.  Reported depression, suicidal ideations, paranoid ideations.  Patient presents with improvement compared to admission.  Mood improved, affect more reactive, self-referential/psychotic symptoms have subsided and currently is not present psychotic.  Denies SI.  Future oriented and currently focused on disposition planning/securing a rehab bed.  Homelessness/poor support system are described as major stressors.  Tolerating medications well at this time.  Treatment Plan Summary: Daily contact with patient to assess and evaluate symptoms and progress in treatment and Medication management  Treatment plan reviewed as below today 11/25 Continue to encourage group  and milieu participation to work on coping skills and symptom reduction Continue to encourage sobriety and abstinence efforts Continue Wellbutrin XL 150 mg QDAY for depression Continue Neurontin 300 mg TID for anxiety Continue Vistaril 50 mg Q6 hours PRN for anxiety as needed  Continue Seroquel 25 mgrs BID and 300 mg QHS for mood disorder/paranoia  Continue Trazodone 50 mg QHS PRN for insomnia as needed  Treatment team working on disposition planning as above.  Jenne Campus, MD 07/21/2018, 11:39 AM   Patient  ID: Gifford Shave, male   DOB: Aug 25, 1986, 32 y.o.   MRN: [id number]

## 2018-07-21 NOTE — Progress Notes (Signed)
Suicide Prevention Education:  Contact Attempts: Glendon Axeam Trang, probation officer [phone] has been identified by the patient as the family member/significant other with whom the patient will be residing, and identified as the person(s) who will aid the patient in the event of a mental health crisis. With written consent from the patient, two attempts were made to provide suicide prevention education, prior to and/or following the patient's discharge. We were unsuccessful in providing suicide prevention education. A suicide education pamphlet was given to the patient to share with family/significant other. ? Date and time of first attempt: 07/21/18     1:05pm Left voicemail, requesting a call back

## 2018-07-21 NOTE — Progress Notes (Signed)
Attended group 

## 2018-07-21 NOTE — Progress Notes (Signed)
Recreation Therapy Notes  Date: 11.25.19 Time: 0930 Location: 300 [address] Dayroom  Group Topic: Stress Management  Goal Area(s) Addresses:  Patient will verbalize importance of using healthy stress management.  Patient will identify positive emotions associated with healthy stress management.   Behavioral Response: Engaged  Intervention: Stress Management  Activity :  Guided Imagery.  LRT introduced the stress management technique of guided imagery to patients.  LRT read a script that allowed patients to envision being outside looking at a starry sky at night.  Patients were to listen and follow along as script was read to engage in activity.  Education:  Stress Management, Discharge Planning.   Education Outcome: Acknowledges edcuation/In group clarification offered/Needs additional education  Clinical Observations/Feedback: Pt attended and participated in activity.     Caroll RancherMarjette Virga Haltiwanger, LRT/CTRS    Lillia AbedLindsay, Emmanuel Gruenhagen A 07/21/2018 11:06 AM

## 2018-07-22 MED ORDER — QUETIAPINE FUMARATE 25 MG PO TABS: 25.0000 mg | ORAL_TABLET | Freq: Two times a day (BID) | ORAL | 0 refills | Status: AC

## 2018-07-22 MED ORDER — TRAZODONE HCL 50 MG PO TABS: 50.0000 mg | ORAL_TABLET | Freq: Every evening | ORAL | 0 refills | Status: AC | PRN

## 2018-07-22 MED ORDER — GABAPENTIN 300 MG PO CAPS: 300.0000 mg | ORAL_CAPSULE | Freq: Three times a day (TID) | ORAL | 0 refills | Status: AC

## 2018-07-22 MED ORDER — HYDROXYZINE HCL 50 MG PO TABS: 50.0000 mg | ORAL_TABLET | Freq: Four times a day (QID) | ORAL | 0 refills | Status: AC | PRN

## 2018-07-22 MED ORDER — QUETIAPINE FUMARATE 300 MG PO TABS: 300.0000 mg | ORAL_TABLET | Freq: Every day | ORAL | 0 refills | Status: AC

## 2018-07-22 MED ORDER — BUPROPION HCL ER (XL) 150 MG PO TB24: 150.0000 mg | ORAL_TABLET | Freq: Every day | ORAL | 0 refills | Status: AC
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
## 2018-07-22 NOTE — [facility] Suicide Risk Assessment (Signed)
[facility]   Principal Problem: Bipolar disorder, unspecified (HCC) Discharge Diagnoses: Principal Problem:   Bipolar disorder, unspecified (HCC)   Total Time spent with patient: 30 minutes   Psychiatric Specialty Exam:   Blood pressure (!) 142/85, pulse (!) 129, temperature 97.6 F (36.4 C), temperature source Oral, resp. rate 16, height 5\' 6"  (1.676 m), weight 117.9 kg.Body mass index is 41.97 kg/m.   Mental Status Per Nursing Assessment::   On Admission:  Suicidal ideation indicated by patient  Demographic Factors:  Male, Low socioeconomic status, Living alone and Unemployed  Loss Factors: Legal issues and Financial problems/change in socioeconomic status  Historical Factors: Impulsivity  Risk Reduction Factors:   Positive social support, Positive therapeutic relationship and Positive coping skills or problem solving skills  Continued Clinical Symptoms:  Bipolar Disorder:   Depressive phase Alcohol/Substance Abuse/Dependencies  Cognitive Features That Contribute To Risk:  None    Suicide Risk:  Minimal: No identifiable suicidal ideation.  Patients presenting with no risk factors but with morbid ruminations; may be classified as minimal risk based on the severity of the depressive symptoms  Follow-up Information    [facility] Follow up.   Why:  Please walk in within 3 days of hospital discharge to be assessed for outpatient mental health services. Walk in Mon-Fri at 8:30AM. Thank you.  Contact information: [address] [place], [place] [address] Phone: [phone] Fax: [phone]          Plan Of Care/Follow-up recommendations:  Activity:  as tolerated Diet:  normal Tests:  NA Other:  see above for DC plan  Micheal Likenshristopher T Riyad Keena, MD 07/22/2018, 11:17 AM

## 2018-07-22 NOTE — Progress Notes (Signed)
  [facility] :  Will you be returning to the same living situation after discharge:  No.Pt is planning to attend [facility].  At discharge, do you have transportation home?: Yes,  [place] bus pass and [place] money provided to pt.  Do you have the ability to pay for your medications: Yes,  mental health  Release of information consent forms completed and submitted to medical records by CSW.   Patient to Follow up at: Follow-up Information    [facility] Follow up.   Why:  Please walk in within 3 days of hospital discharge to be assessed for outpatient mental health services. Walk in Mon-Fri at 8:30AM. Thank you.  Contact information: [address] [place], [place] [address] Phone: [phone] Fax: [phone]          Next level of care provider has access to [facility] Link:no  Safety Planning and Suicide Prevention discussed: Yes,  SPE completed with pt; pt declined to consent to collateral contact. SPI pamphlet and mobile crisis information provided to pt.   Have you used any form of tobacco in the last 30 days? (Cigarettes, Smokeless Tobacco, Cigars, and/or Pipes): Yes  Has patient been referred to the Quitline?: Patient refused referral  Patient has been referred for addiction treatment: Yes  Rona RavensHeather S Demarquis Osley, LCSW 07/22/2018, 10:45 AM

## 2018-07-22 NOTE — Progress Notes (Signed)
D: Pt A & O X 4. Denies SI, HI, AVH and pain at this time. [facility] as ordered. Bus passes given ([employer] & [place]).  A: D/C instructions reviewed with pt including prescriptions, medication samples and follow up appointment; compliance encouraged. All belongings from assi given to pt at time of departure. Scheduled and PRN medications given with verbal education and effects monitored. Safety checks maintained without incident till time of d/c.  R: Pt receptive to care. Compliant with medications when offered. Denies adverse drug reactions when assessed. Verbalized understanding related to d/c instructions. Signed belonging sheet in agreement with items received from locker. Ambulatory with a steady gait. Appears to be in no physical distress at time of departure.

## 2018-07-22 NOTE — Progress Notes (Signed)
Shift assessment note:  Pt reports he had a good day.  Pt denies SI/HI/AVH.  He makes his needs known to staff.  He has been in the dayroom watching TV and talking with other patients.  Pt still unsure where he will be going at discharge.  Support and encouragement offered.  Discharge plans are in process.  Safety maintained with q15 minute checks.

## 2018-07-22 NOTE — Progress Notes (Addendum)
CSW left message for [name] at [facility] to check status of referral. CSW instructed pt to call [facility] to check status of referral. Pt met with CSW and MD to discuss discharge. Pt is aware that he will be discharging today. Pt has been turned down at [facility] due to his chronic mental health issues--"they told me that I wouldn't be a good fit." Pt is also given information to [facility] and [facility]. He has phone screening at [facility] for 9:30AM.  Micheal Kirk. Ouida Sills, MSW, LCSW Clinical Social Worker 07/22/2018 9:13 AM   Pt declined at [facility] due to court dates including assault charges. Pt agreeable to [facility] and has been instructed to contact his probation officer--CSW left messages but has not gotten a call back.  Yoshimi Sarr S. Ouida Sills, MSW, LCSW Clinical Social Worker 07/22/2018 10:04 AM

## 2018-12-16 IMAGING — CR DG CHEST 2V
2 series · 2 of 2 positions shown · non-contrast
Comparison: None.

CLINICAL DATA: Patient with cough.  Shortness of breath.

EXAM:
CHEST - 2 VIEW

[w chest pa]
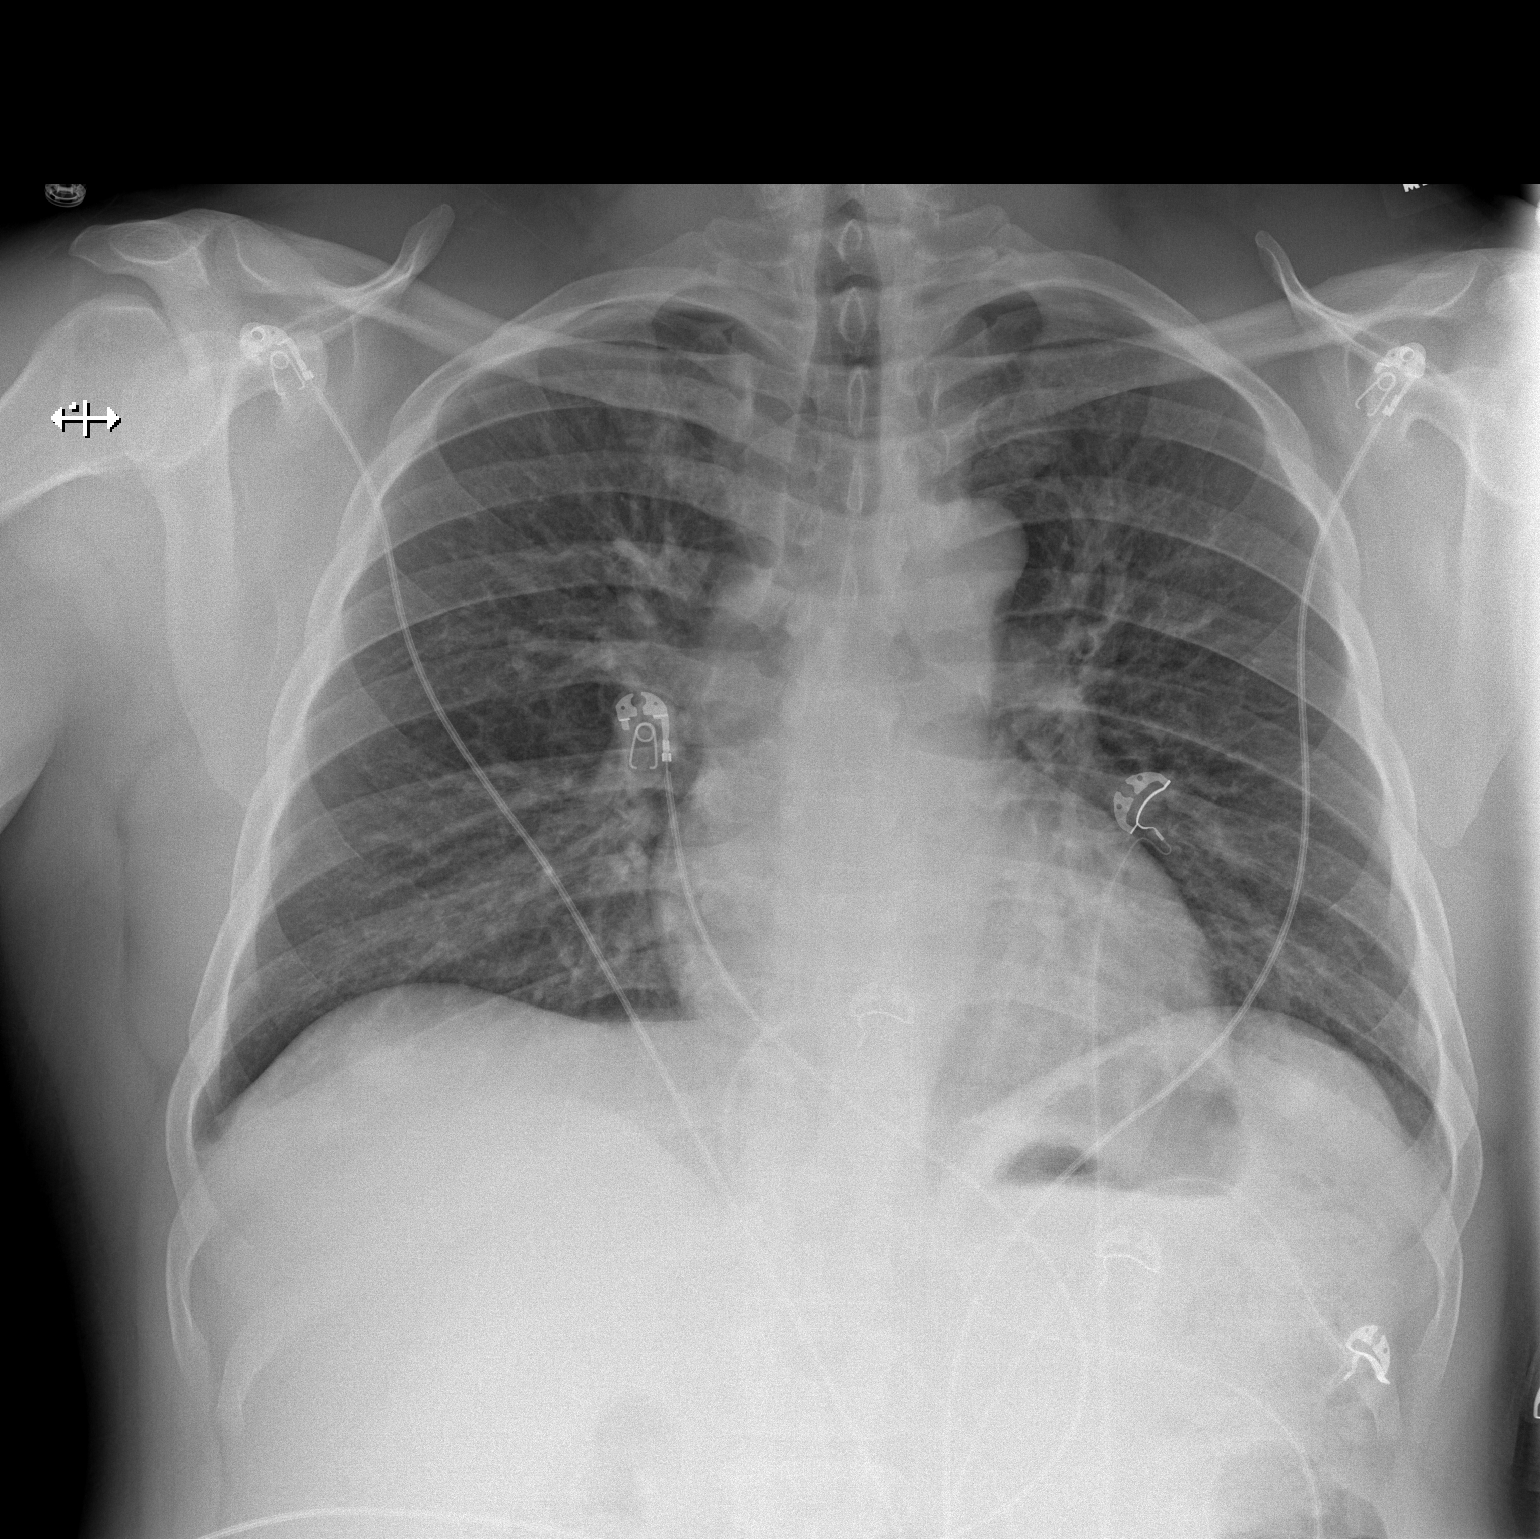

[w chest lat]
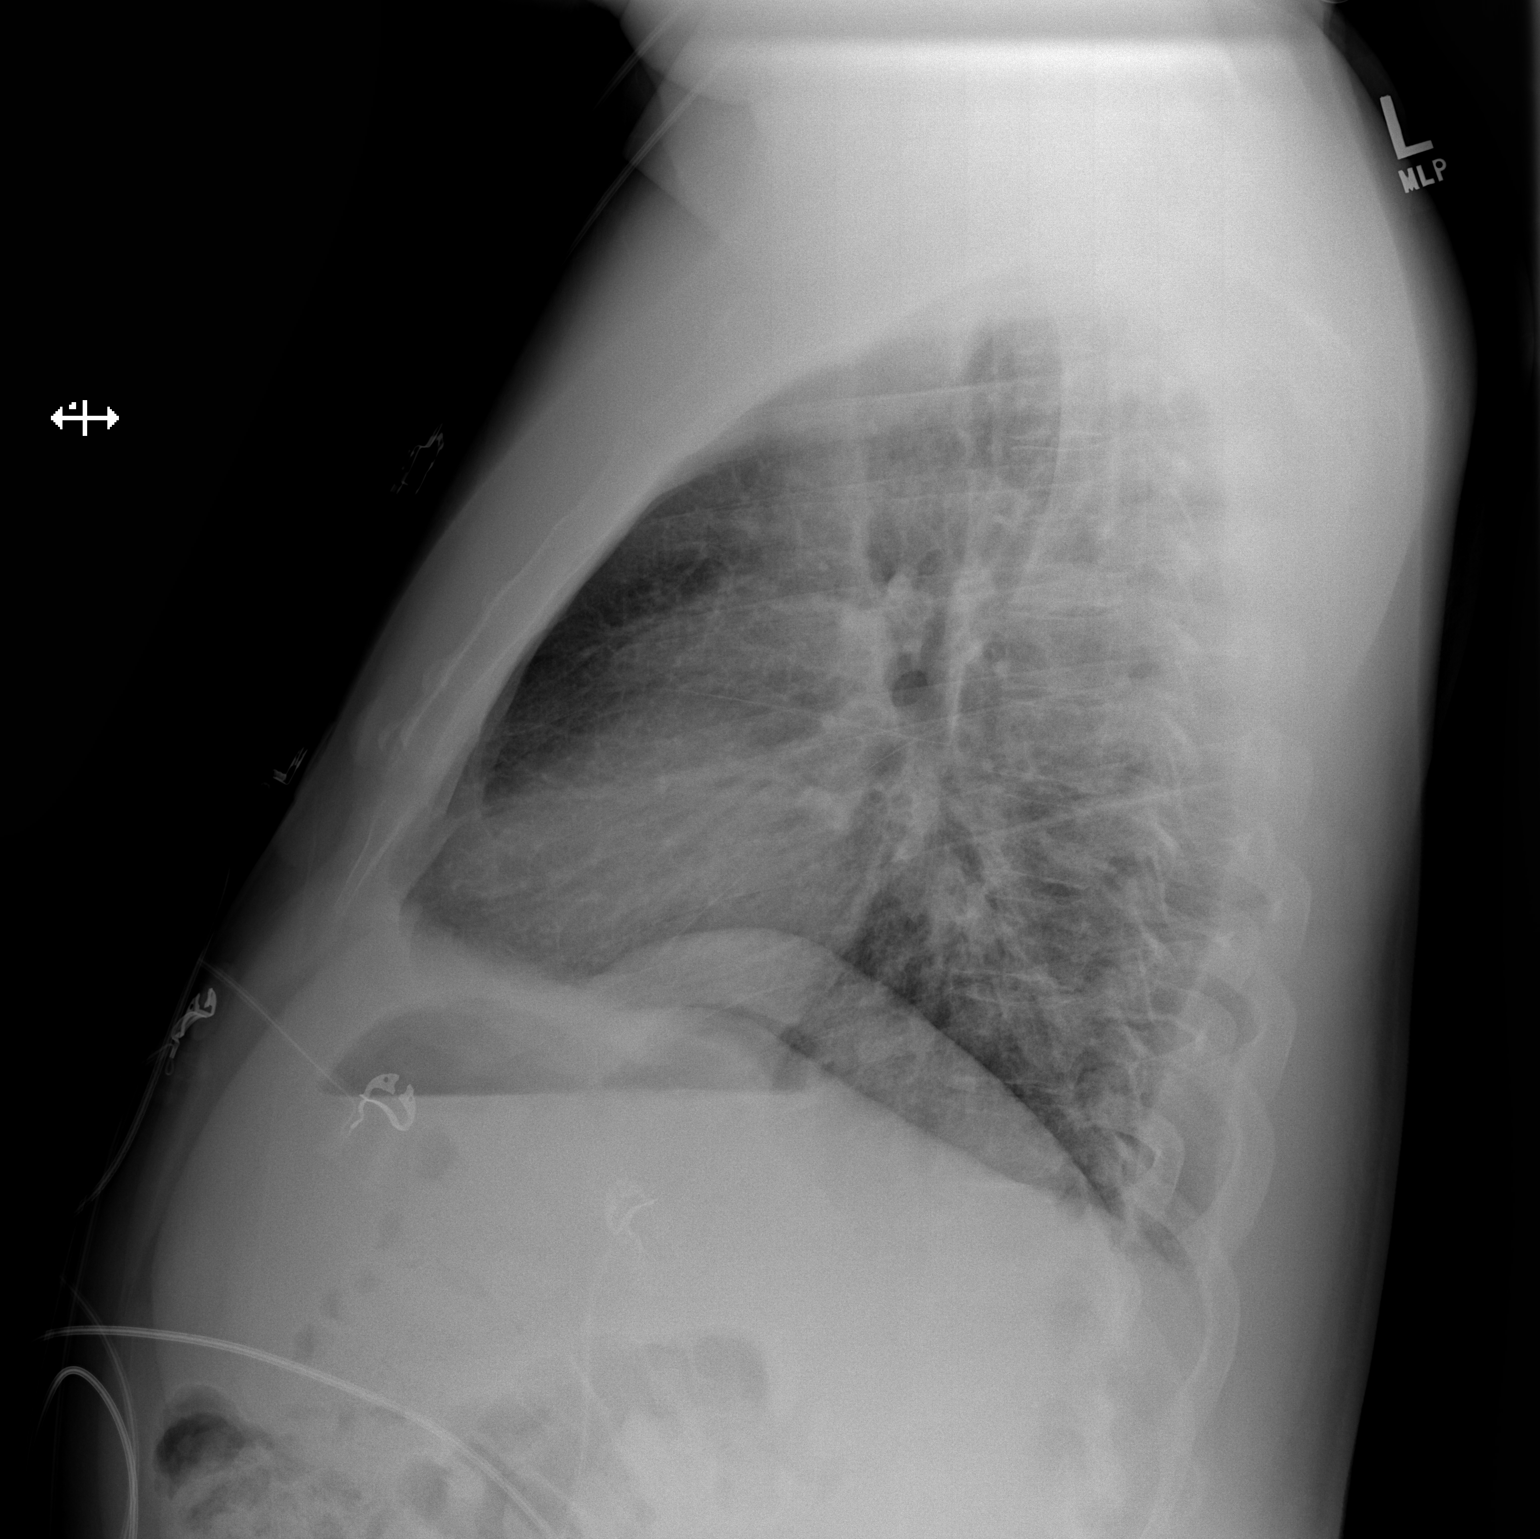

[2 of 2 positions shown; findings below may reference images not displayed]

FINDINGS: Monitoring leads overlie the patient. Normal cardiac and mediastinal
contours. No consolidative pulmonary opacities. No pleural effusion
pneumothorax. Regional skeleton is unremarkable.
IMPRESSION: No acute cardiopulmonary process.

## 2019-01-29 IMAGING — DX DG CHEST 1V PORT
1 series · 1 of 1 positions shown · non-contrast
Comparison: 04/27/2018

CLINICAL DATA: Respiratory distress

EXAM:
PORTABLE CHEST 1 VIEW

[chest ap]
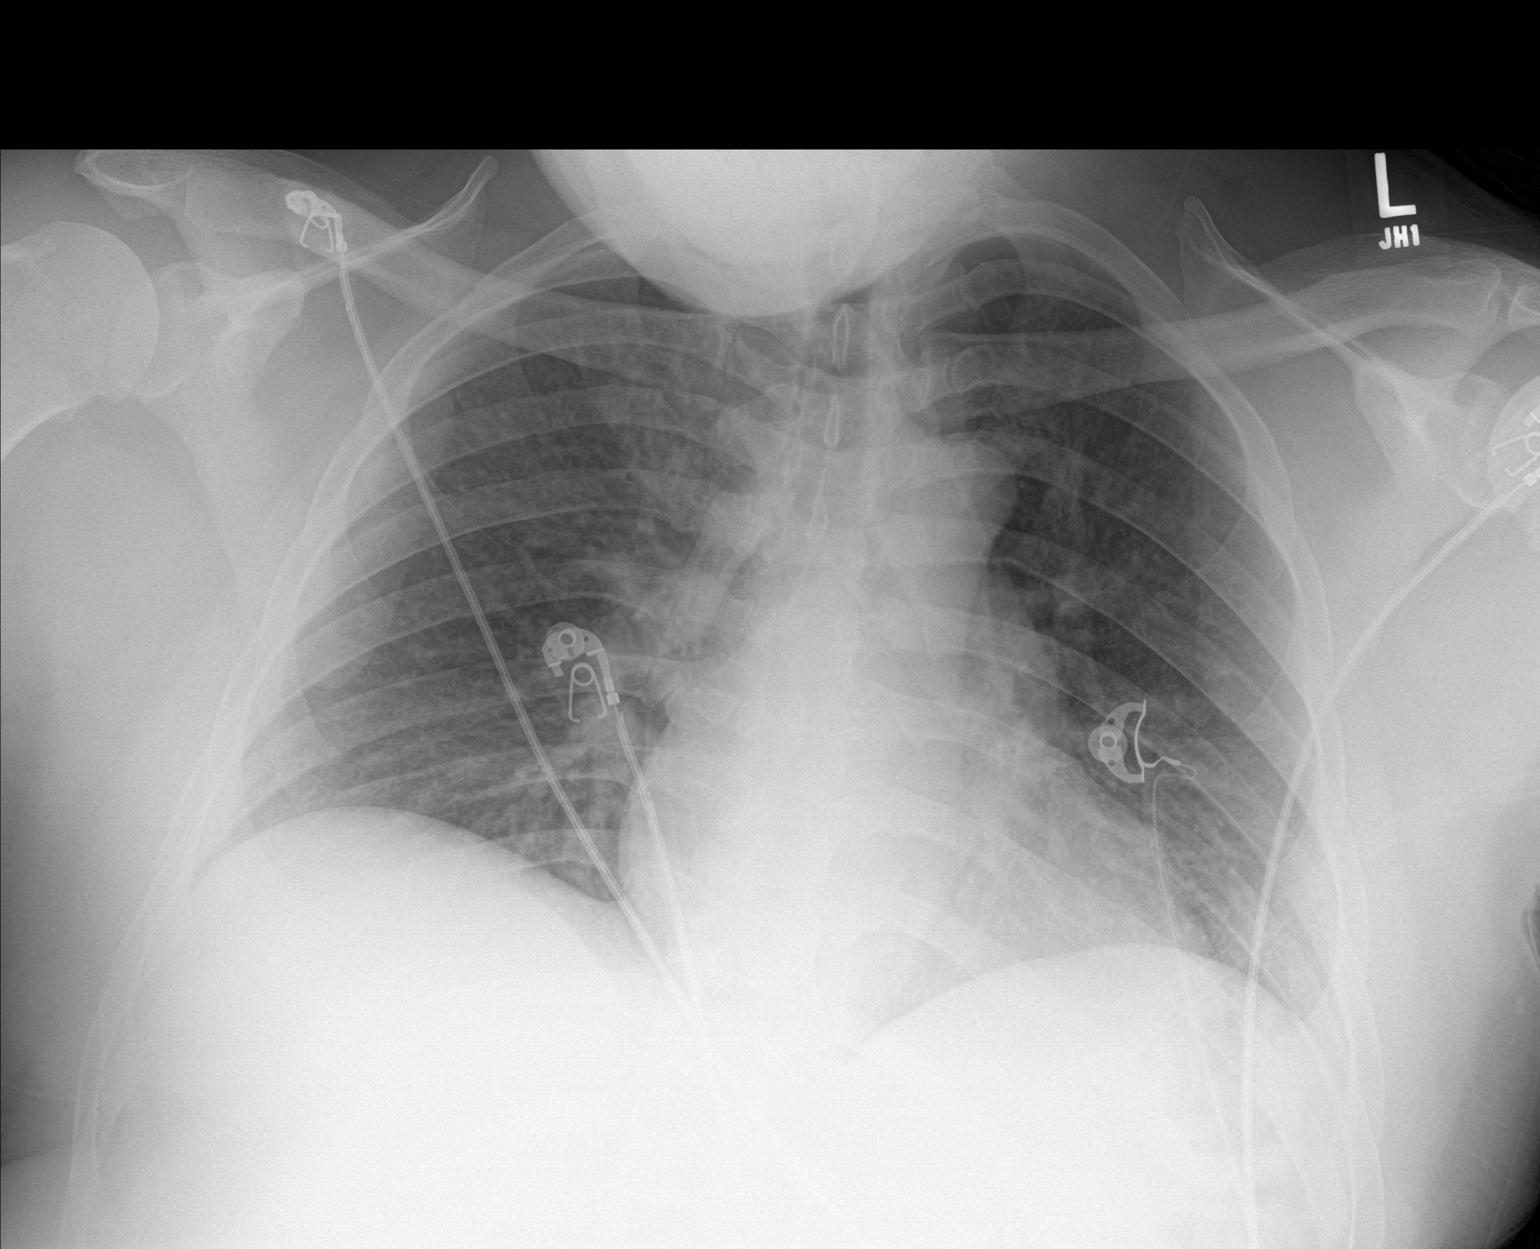

[1 of 1 positions shown; findings below may reference images not displayed]

FINDINGS: Mild diffuse interstitial opacity. No pleural effusion. Normal heart
size. No pneumothorax.
IMPRESSION: Mild diffuse interstitial opacity, could be secondary to reactive
airways or interstitial inflammatory process. No focal airspace
disease.

## 2021-05-27 DEATH — deceased
